# Patient Record
Sex: Male | Born: 1975 | Race: Black or African American | Hispanic: No | Marital: Married | Smoking: Former smoker
Health system: Southern US, Community
[De-identification: ages and names within clinical notes are randomized; demographics above are authoritative.]

## PROBLEM LIST (undated history)

## (undated) DIAGNOSIS — E785 Hyperlipidemia, unspecified: Secondary | ICD-10-CM

## (undated) DIAGNOSIS — E291 Testicular hypofunction: Secondary | ICD-10-CM

## (undated) DIAGNOSIS — F419 Anxiety disorder, unspecified: Secondary | ICD-10-CM

## (undated) DIAGNOSIS — F32A Depression, unspecified: Secondary | ICD-10-CM

## (undated) DIAGNOSIS — E119 Type 2 diabetes mellitus without complications: Secondary | ICD-10-CM

## (undated) DIAGNOSIS — I10 Essential (primary) hypertension: Secondary | ICD-10-CM

## (undated) HISTORY — DX: Testicular hypofunction: E29.1

## (undated) HISTORY — DX: Depression, unspecified: F32.A

## (undated) HISTORY — DX: Hyperlipidemia, unspecified: E78.5

## (undated) HISTORY — PX: TYMPANOSTOMY TUBE PLACEMENT: SHX32

---

## 2008-01-23 ENCOUNTER — Ambulatory Visit: Payer: Self-pay | Admitting: Cardiology

## 2012-05-29 DIAGNOSIS — E785 Hyperlipidemia, unspecified: Secondary | ICD-10-CM | POA: Insufficient documentation

## 2012-05-29 DIAGNOSIS — R400 Somnolence: Secondary | ICD-10-CM | POA: Insufficient documentation

## 2012-05-29 DIAGNOSIS — I10 Essential (primary) hypertension: Secondary | ICD-10-CM | POA: Insufficient documentation

## 2012-05-29 DIAGNOSIS — F32A Depression, unspecified: Secondary | ICD-10-CM | POA: Insufficient documentation

## 2012-08-16 DIAGNOSIS — E669 Obesity, unspecified: Secondary | ICD-10-CM | POA: Insufficient documentation

## 2012-08-23 ENCOUNTER — Emergency Department (HOSPITAL_COMMUNITY)
Admission: EM | Admit: 2012-08-23 | Discharge: 2012-08-23 | Disposition: A | Discharge status: Home / Self Care | Payer: 59 | Attending: Emergency Medicine | Admitting: Emergency Medicine

## 2012-08-23 ENCOUNTER — Emergency Department (HOSPITAL_COMMUNITY): Payer: 59

## 2012-08-23 ENCOUNTER — Encounter (HOSPITAL_COMMUNITY): Payer: Self-pay | Admitting: *Deleted

## 2012-08-23 DIAGNOSIS — Z8659 Personal history of other mental and behavioral disorders: Secondary | ICD-10-CM | POA: Insufficient documentation

## 2012-08-23 DIAGNOSIS — R002 Palpitations: Secondary | ICD-10-CM | POA: Insufficient documentation

## 2012-08-23 DIAGNOSIS — R059 Cough, unspecified: Secondary | ICD-10-CM | POA: Insufficient documentation

## 2012-08-23 DIAGNOSIS — R631 Polydipsia: Secondary | ICD-10-CM | POA: Insufficient documentation

## 2012-08-23 DIAGNOSIS — R110 Nausea: Secondary | ICD-10-CM | POA: Insufficient documentation

## 2012-08-23 DIAGNOSIS — R05 Cough: Secondary | ICD-10-CM | POA: Insufficient documentation

## 2012-08-23 DIAGNOSIS — R51 Headache: Secondary | ICD-10-CM | POA: Insufficient documentation

## 2012-08-23 DIAGNOSIS — R6883 Chills (without fever): Secondary | ICD-10-CM | POA: Insufficient documentation

## 2012-08-23 DIAGNOSIS — R6889 Other general symptoms and signs: Secondary | ICD-10-CM | POA: Insufficient documentation

## 2012-08-23 DIAGNOSIS — R61 Generalized hyperhidrosis: Secondary | ICD-10-CM | POA: Insufficient documentation

## 2012-08-23 DIAGNOSIS — R42 Dizziness and giddiness: Secondary | ICD-10-CM | POA: Insufficient documentation

## 2012-08-23 DIAGNOSIS — I10 Essential (primary) hypertension: Secondary | ICD-10-CM | POA: Insufficient documentation

## 2012-08-23 DIAGNOSIS — E119 Type 2 diabetes mellitus without complications: Secondary | ICD-10-CM | POA: Insufficient documentation

## 2012-08-23 DIAGNOSIS — J189 Pneumonia, unspecified organism: Secondary | ICD-10-CM | POA: Insufficient documentation

## 2012-08-23 HISTORY — DX: Anxiety disorder, unspecified: F41.9

## 2012-08-23 HISTORY — DX: Type 2 diabetes mellitus without complications: E11.9

## 2012-08-23 HISTORY — DX: Essential (primary) hypertension: I10

## 2012-08-23 LAB — CBC WITH DIFFERENTIAL/PLATELET
Hemoglobin: 13.2 g/dL (ref 13.0–17.0)
Lymphocytes Relative: 36 % (ref 12–46)
Lymphs Abs: 3.9 10*3/uL (ref 0.7–4.0)
MCV: 76.3 fL — ABNORMAL LOW (ref 78.0–100.0)
Monocytes Relative: 7 % (ref 3–12)
Neutrophils Relative %: 55 % (ref 43–77)
Platelets: 304 10*3/uL (ref 150–400)
RBC: 5.15 MIL/uL (ref 4.22–5.81)
WBC: 10.8 10*3/uL — ABNORMAL HIGH (ref 4.0–10.5)

## 2012-08-23 LAB — POCT I-STAT TROPONIN I: Troponin i, poc: 0.01 ng/mL (ref 0.00–0.08)

## 2012-08-23 LAB — COMPREHENSIVE METABOLIC PANEL
AST: 33 U/L (ref 0–37)
Albumin: 3.6 g/dL (ref 3.5–5.2)
Calcium: 9.4 mg/dL (ref 8.4–10.5)
Creatinine, Ser: 0.76 mg/dL (ref 0.50–1.35)
Potassium: 4.6 mEq/L (ref 3.5–5.1)
Sodium: 135 mEq/L (ref 135–145)
Total Protein: 7.4 g/dL (ref 6.0–8.3)

## 2012-08-23 LAB — LIPASE, BLOOD: Lipase: 36 U/L (ref 11–59)

## 2012-08-23 MED ORDER — AZITHROMYCIN 250 MG PO TABS: 250.0000 mg | ORAL_TABLET | Freq: Every day | ORAL | Status: DC

## 2012-08-23 MED ORDER — SODIUM CHLORIDE 0.9 % IV BOLUS (SEPSIS)
1000.0000 mL | Freq: Once | INTRAVENOUS | Status: AC
  Administered 2012-08-23: 1000 mL via INTRAVENOUS

## 2012-08-23 NOTE — ED Provider Notes (Signed)
History     CSN: [id number]  Arrival date & time 08/23/12  1022   First MD Initiated Contact with Patient 08/23/12 1028      Chief Complaint  Patient presents with  . Chest Pain    (Consider location/radiation/quality/duration/timing/severity/associated sxs/prior treatment) HPI Comments: Pt has been experiencing palpitations at rest, nausea, diaphoresis, and headaches for the past 2 days. Pt does not experience symptoms with exertion. Called his PCP and went in today for an appointment and was sent here by PCP.  Pt sent he felt these palpitations 2-3 years ago, saw a cardiologist, had a stress test, and told everything was negative. He was also successfully treated for vertigo about the same time for the headaches, dizziness, and nausea.   Pt denies any shortness of breath, difficulty breathing, pleuritic chest pain.  EKG at doctor's office shows non-specific T wave changes.   Patient is a 37 y.o. male presenting with chest pain.  Chest Pain Associated symptoms: cough, dizziness, headache, nausea and palpitations   Associated symptoms: no abdominal pain, no diaphoresis, no fever, no numbness, no shortness of breath, not vomiting and no weakness     Past Medical History  Diagnosis Date  . Hypertension   . Diabetes mellitus without complication   . Anxiety     History reviewed. No pertinent past surgical history.  History reviewed. No pertinent family history.  History  Substance Use Topics  . Smoking status: Never Smoker   . Smokeless tobacco: Not on file  . Alcohol Use: No      Review of Systems  Constitutional: Positive for chills. Negative for fever and diaphoresis.  HENT: Negative for neck pain and neck stiffness.   Eyes: Negative for visual disturbance.  Respiratory: Positive for cough. Negative for apnea, chest tightness and shortness of breath.   Cardiovascular: Positive for palpitations. Negative for chest pain.  Gastrointestinal: Positive for nausea.  Negative for vomiting, abdominal pain, diarrhea and constipation.  Endocrine: Positive for cold intolerance and polydipsia.  Genitourinary: Negative for dysuria.  Musculoskeletal: Negative for gait problem.  Skin: Negative for rash.  Neurological: Positive for dizziness, light-headedness and headaches. Negative for weakness and numbness.    Allergies  Review of patient's allergies indicates no known allergies.  Home Medications  No current outpatient prescriptions on file.  There were no vitals taken for this visit.  Physical Exam  Nursing note and vitals reviewed. Constitutional: He is oriented to person, place, and time. He appears well-developed and well-nourished. No distress.  HENT:  Head: Normocephalic and atraumatic.  Eyes: EOM are normal. Pupils are equal, round, and reactive to light.  Neck: Normal range of motion. Neck supple.  No meningeal signs  Cardiovascular: Normal rate, regular rhythm, normal heart sounds and intact distal pulses.  Exam reveals no gallop and no friction rub.   No murmur heard. Pulmonary/Chest: Effort normal and breath sounds normal. No respiratory distress. He has no wheezes. He has no rales. He exhibits no tenderness.  Abdominal: Soft. Bowel sounds are normal. He exhibits no distension. There is no tenderness. There is no rebound and no guarding.  Musculoskeletal: Normal range of motion. He exhibits no edema and no tenderness.  5/5 strength throughout. Good grip strength.  Neurological: He is alert and oriented to person, place, and time. No cranial nerve deficit.  No focal deficits. Sensitivity to light touch intact.  Skin: Skin is warm and dry. He is not diaphoretic. No erythema.    ED Course  Procedures (including critical care time)  Labs Reviewed  CBC WITH DIFFERENTIAL  COMPREHENSIVE METABOLIC PANEL  LIPASE, BLOOD   Results for orders placed during the hospital encounter of 08/23/12  CBC WITH DIFFERENTIAL      Result Value Range    WBC 10.8 (*) 4.0 - 10.5 K/uL   RBC 5.15  4.22 - 5.81 MIL/uL   Hemoglobin 13.2  13.0 - 17.0 g/dL   HCT 16.1  09.6 - 04.5 %   MCV 76.3 (*) 78.0 - 100.0 fL   MCH 25.6 (*) 26.0 - 34.0 pg   MCHC 33.6  30.0 - 36.0 g/dL   RDW 40.9  81.1 - 91.4 %   Platelets 304  150 - 400 K/uL   Neutrophils Relative 55  43 - 77 %   Neutro Abs 5.9  1.7 - 7.7 K/uL   Lymphocytes Relative 36  12 - 46 %   Lymphs Abs 3.9  0.7 - 4.0 K/uL   Monocytes Relative 7  3 - 12 %   Monocytes Absolute 0.8  0.1 - 1.0 K/uL   Eosinophils Relative 2  0 - 5 %   Eosinophils Absolute 0.2  0.0 - 0.7 K/uL   Basophils Relative 0  0 - 1 %   Basophils Absolute 0.0  0.0 - 0.1 K/uL  COMPREHENSIVE METABOLIC PANEL      Result Value Range   Sodium 135  135 - 145 mEq/L   Potassium 4.6  3.5 - 5.1 mEq/L   Chloride 99  96 - 112 mEq/L   CO2 27  19 - 32 mEq/L   Glucose, Bld 292 (*) 70 - 99 mg/dL   BUN 13  6 - 23 mg/dL   Creatinine, Ser 7.82  0.50 - 1.35 mg/dL   Calcium 9.4  8.4 - 95.6 mg/dL   Total Protein 7.4  6.0 - 8.3 g/dL   Albumin 3.6  3.5 - 5.2 g/dL   AST 33  0 - 37 U/L   ALT 51  0 - 53 U/L   Alkaline Phosphatase 91  39 - 117 U/L   Total Bilirubin 0.3  0.3 - 1.2 mg/dL   GFR calc non Af Amer >90  >90 mL/min   GFR calc Af Amer >90  >90 mL/min  LIPASE, BLOOD      Result Value Range   Lipase 36  11 - 59 U/L  POCT I-STAT TROPONIN I      Result Value Range   Troponin i, poc 0.01  0.00 - 0.08 ng/mL   Comment 3             Dg Chest 2 View  08/23/2012  *RADIOLOGY REPORT*  Clinical Data: Chest pain  CHEST - 2 VIEW  Comparison: None.  Findings: Low volumes.  Bibasilar atelectasis.  Normal heart size. No pneumothorax.  IMPRESSION: Bibasilar atelectasis.   Original Report Authenticated By: Jolaine Click, M.D.      Date: 08/23/2012  Rate: 87  Rhythm: sinus rhythm  QRS Axis: normal  Intervals: normal  ST/T Wave abnormalities: borderline T abnormalities, inferior leads  Conduction Disutrbances: none  Narrative Interpretation:  borderline EKG  Old EKG Reviewed: similar to EKG at PCP office earlier  No results found.   Diagnosis: pneumonia   MDM  Low clinical suspicion for ACS or PE.  Chest pain is not likely of cardiac or pulmonary etiology d/t presentation, perc negative, no tracheal deviation, no JVD or new murmur, RRR, breath sounds equal bilaterally, EKG without acute abnormalities, negative troponin.   CXR shows bibasilar  atelectasis could be concerning for possible pneumonia with ROS of cough, SOB, dizziness. Will d/c pt with zithromycin.  Pt has been advised to return to the ED is CP becomes exertional, associated with diaphoresis or nausea, radiates to left jaw/arm, worsens or becomes concerning in any way. Pt appears reliable for follow up and is agreeable to discharge. Patient is to be discharged with recommendation to follow up with PCP in regards to today's hospital visit.  Case has been discussed with and seen by Dr. Hyacinth Meeker who agrees with the above plan to discharge.    Glade Nurse, PA-C 08/23/12 [phone]

## 2012-08-23 NOTE — ED Notes (Signed)
Pt sent here from his PCP in reference to CP intermittently x couple of days.  States N/V/SOB/diaphoretic.  Pt deneis symptoms at this time.  12-lead unremarkable-T wave inversion.  650 aspirin given at PCP.  20ga SL IV (L) hand.

## 2012-08-23 NOTE — ED Notes (Signed)
NAD noted at time of d/c home 

## 2012-08-23 NOTE — ED Provider Notes (Signed)
37 year old obese male with a history of diabetes who presents with a complaint of feeling lightheaded. He has also been complaining of increased coughing over the last several days, becoming diaphoretic at work and feeling dizzy. On exam the patient has moist mucous membranes, normal mental status answering questions appropriately, clear heart and lung sounds without any murmurs rubs or gallops, no ectopy heard on cardiac monitoring and on auscultation.  Labs show the patient is hyperglycemic to close to 300, no other significant lab abnormalities, his EKG from his primary care Dr. showed a sinus rhythm at 95 beats per minute with normal axis, normal intervals, normal ST segments and no signs of LVH.  The patient is well-appearing, we'll treat with Zithromax and IV fluids, I encouraged him to followup closely with his family doctor.  PA and lateral views of the chest were obtained by digital radiography. I have personally interpreted these x-rays and find her to be no signs of pulmonary infiltrate, cardiomegaly, subdiaphragmatic free air, soft tissue abnormality, no obvious bony abnormalities or fractures.  There is atelectasis at the bases which in the setting of increased cough could be significant for an early pneumonia. He does not have a significant leukocytosis.   Medical screening examination/treatment/procedure(s) were conducted as a shared visit with non-physician practitioner(s) and myself.  I personally evaluated the patient during the encounter    Vida Roller, MD 08/23/12 1220

## 2012-08-26 NOTE — ED Provider Notes (Signed)
   Medical screening examination/treatment/procedure(s) were conducted as a shared visit with non-physician practitioner(s) and myself.  I personally evaluated the patient during the encounter  Please see my separate respective documentation pertaining to this patient encounter     Vida Roller, MD 08/26/12 1843

## 2013-12-01 IMAGING — CR DG CHEST 2V
2 series · 2 of 2 positions shown · non-contrast
Comparison: None.

CLINICAL DATA: Chest pain

CHEST - 2 VIEW

[w chest pa]
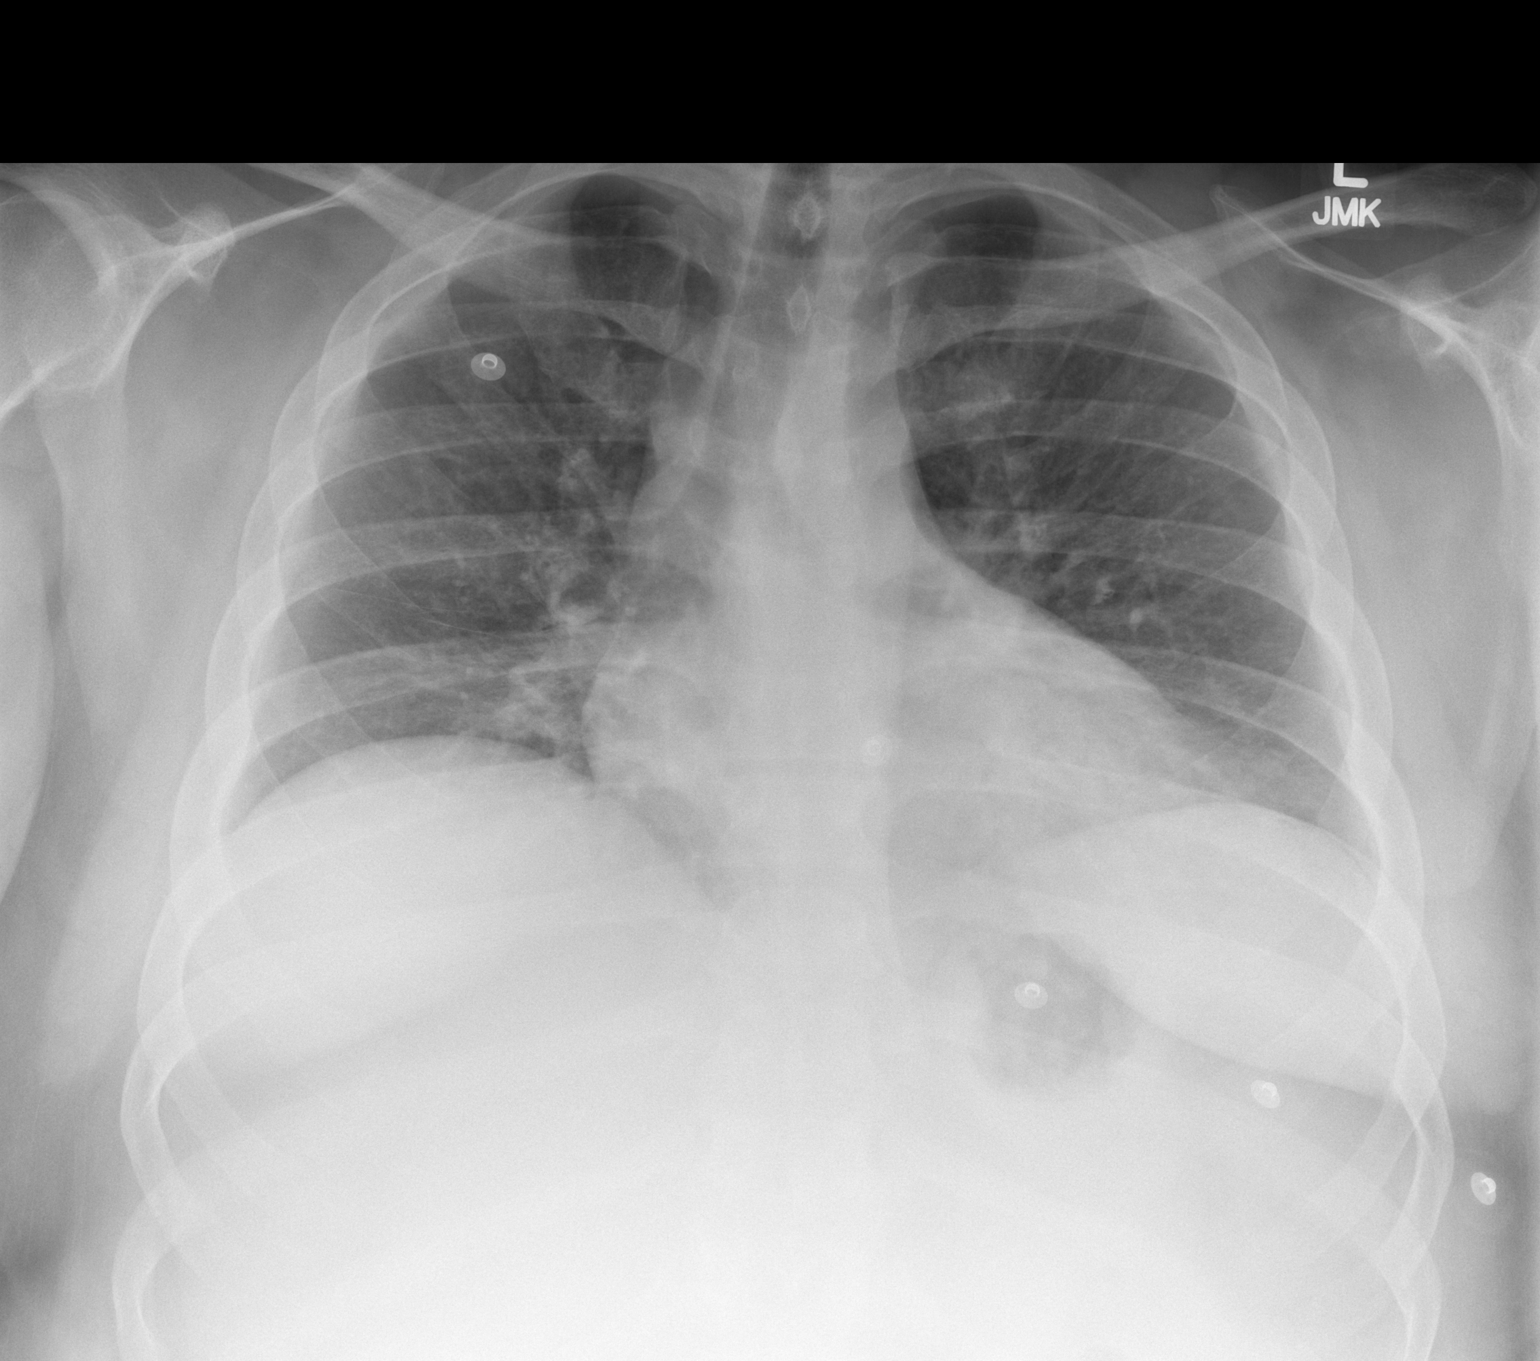

[w chest lat]
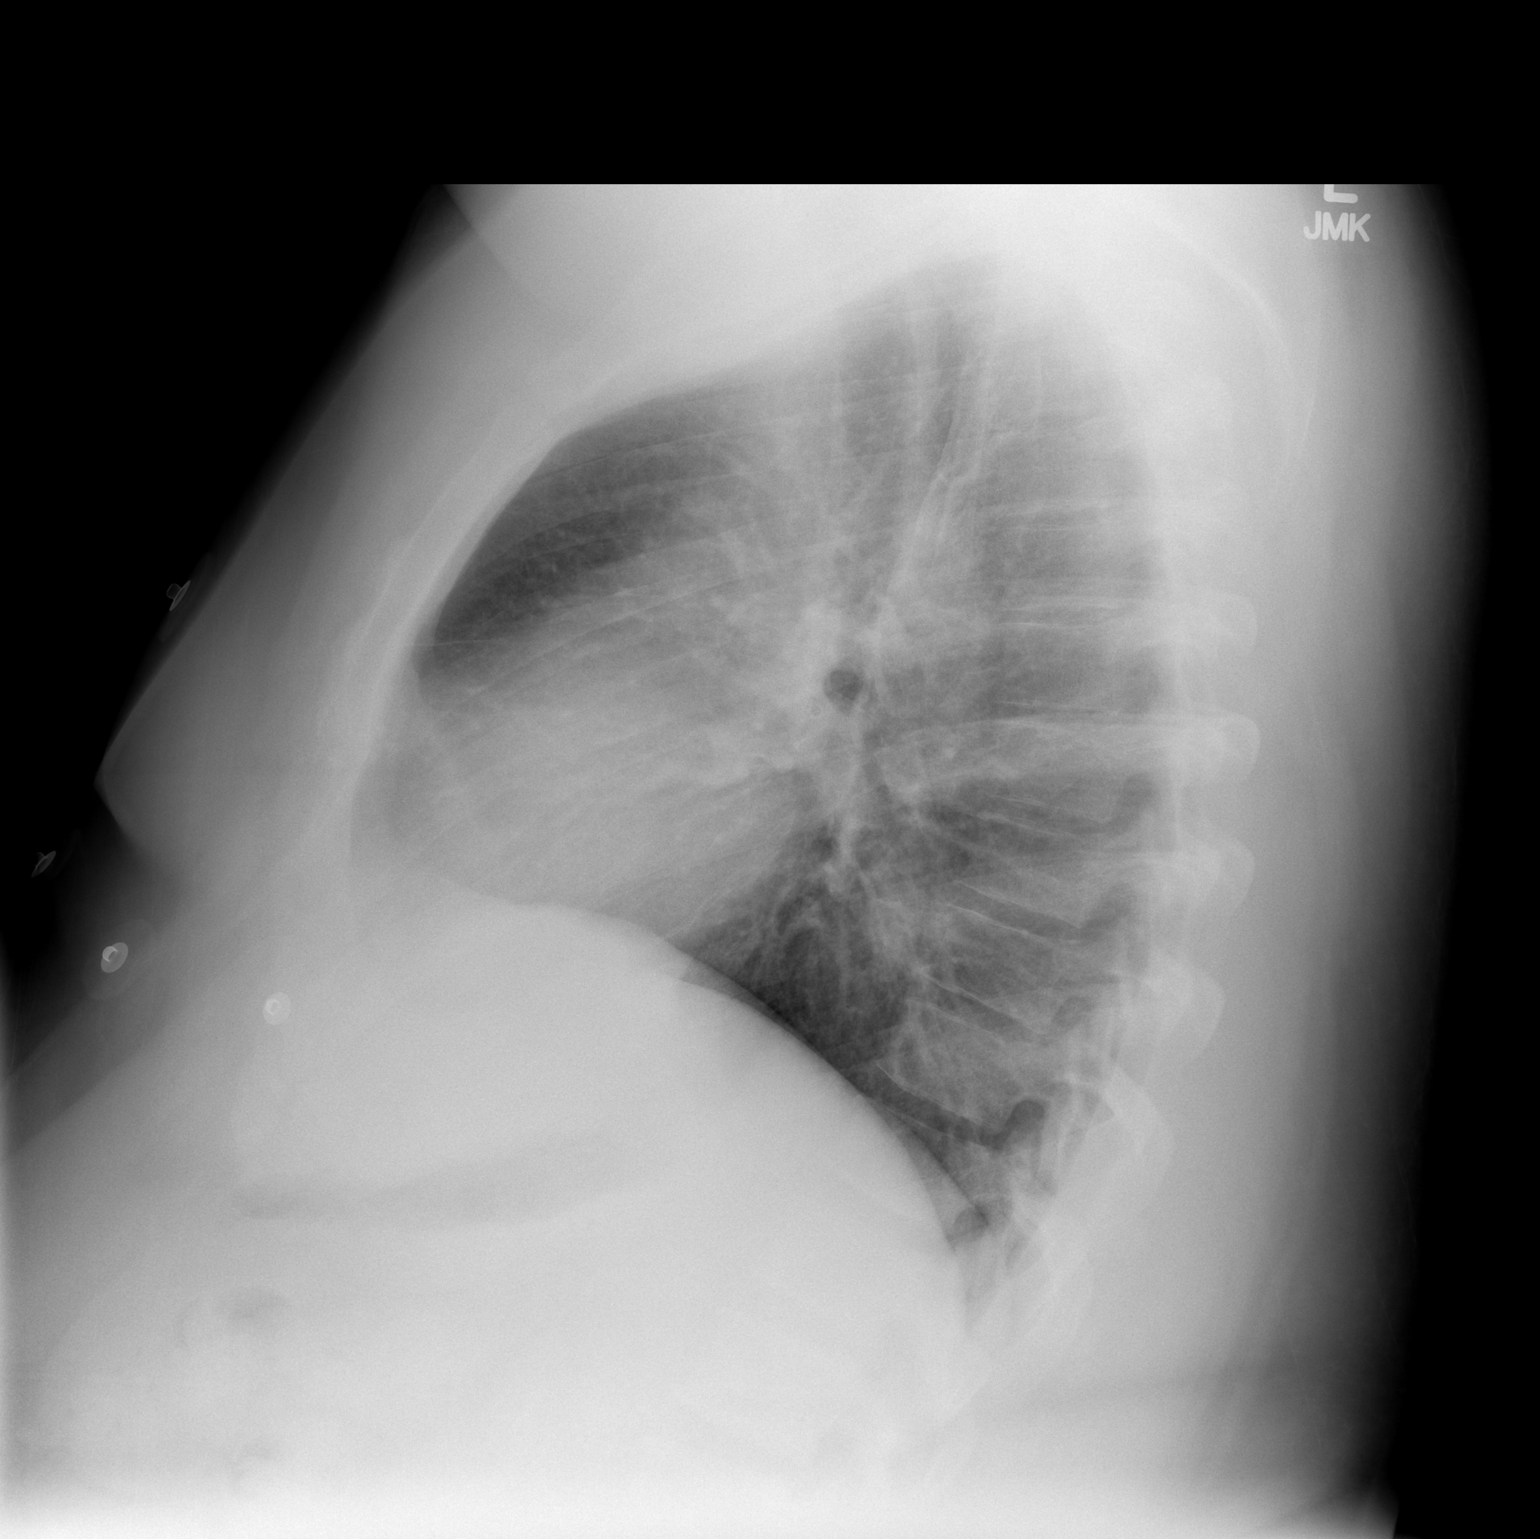

[2 of 2 positions shown; findings below may reference images not displayed]

FINDINGS: Low volumes.  Bibasilar atelectasis.  Normal heart size.
No pneumothorax.
IMPRESSION: Bibasilar atelectasis.

## 2014-05-22 DIAGNOSIS — R202 Paresthesia of skin: Secondary | ICD-10-CM | POA: Insufficient documentation

## 2014-05-22 DIAGNOSIS — E1142 Type 2 diabetes mellitus with diabetic polyneuropathy: Secondary | ICD-10-CM | POA: Insufficient documentation

## 2019-07-02 ENCOUNTER — Other Ambulatory Visit: Payer: Self-pay

## 2019-07-02 ENCOUNTER — Ambulatory Visit
Admission: EM | Admit: 2019-07-02 | Discharge: 2019-07-02 | Disposition: A | Discharge status: Home / Self Care | Payer: Self-pay

## 2019-07-02 DIAGNOSIS — R030 Elevated blood-pressure reading, without diagnosis of hypertension: Secondary | ICD-10-CM

## 2019-07-02 DIAGNOSIS — K047 Periapical abscess without sinus: Secondary | ICD-10-CM

## 2019-07-02 MED ORDER — TRAMADOL HCL 50 MG PO TABS: 50.0000 mg | ORAL_TABLET | Freq: Two times a day (BID) | ORAL | 0 refills | Status: DC | PRN

## 2019-07-02 MED ORDER — AMOXICILLIN-POT CLAVULANATE 875-125 MG PO TABS: 1.0000 | ORAL_TABLET | Freq: Two times a day (BID) | ORAL | 0 refills | Status: AC

## 2019-07-02 MED ORDER — CLONIDINE HCL ER 0.1 MG PO TB12
0.1000 mg | ORAL_TABLET | Freq: Once | ORAL | Status: AC
  Administered 2019-07-02: 0.1 mg via ORAL

## 2019-07-02 MED ORDER — CHLORHEXIDINE GLUCONATE 0.12 % MT SOLN: 15.0000 mL | Freq: Two times a day (BID) | OROMUCOSAL | 0 refills | Status: DC

## 2019-07-02 MED ORDER — CEFTRIAXONE SODIUM 1 G IJ SOLR
1.0000 g | Freq: Once | INTRAMUSCULAR | Status: AC
  Administered 2019-07-02: 13:00:00 1 g via INTRAMUSCULAR

## 2019-07-02 NOTE — ED Triage Notes (Signed)
Pt presents with dental abscess that developed 2 days ago

## 2019-07-02 NOTE — ED Triage Notes (Signed)
Provider made aware of elevated BP

## 2019-07-02 NOTE — ED Provider Notes (Signed)
[facility]   [id number] 07/02/19 Arrival Time: 1141  CC: DENTAL abscess; elevated blood pressure  SUBJECTIVE:  Joshua Chang is a 43 y.o. male who presents with dental abscess x 2 days ago.  Denies a precipitating event or trauma.  Localizes pain to left upper side.  Has tried OTC analgesics without relief.  Worse with chewing.  Does not see a dentist regularly. Complains of associated nausea.  Denies fever, chills, dysphagia, odynophagia, oral or neck swelling, vomiting, chest pain, SOB.    Incidentally, blood pressure elevated in office.  Hx of HTN.  Did not take BP meds this morning.  Takes losartan-HCTZ.  However, has been running high recently.  Was 190/<100 on christmas eve.  Does not have a PCP.  Complains of associated headaches and blurry vision x 1 week, currently asymptomatic.  Denies numbness/ tingling, weakness in arms or legs, slurred speech.    ROS: As per HPI.  All other pertinent ROS negative.     Past Medical History:  Diagnosis Date  . Anxiety   . Diabetes mellitus without complication   . Hypertension    No past surgical history on file. No Known Allergies No current facility-administered medications on file prior to encounter.   Current Outpatient Medications on File Prior to Encounter  Medication Sig Dispense Refill  . losartan-hydrochlorothiazide (HYZAAR) 100-12.5 MG tablet Take 100 tablets by mouth daily.    Marland Kitchen. ALPRAZolam (XANAX) 0.5 MG tablet Take 0.5 mg by mouth daily as needed for anxiety.    Marland Kitchen. aspirin EC 81 MG tablet Take 81 mg by mouth daily.    Marland Kitchen. azithromycin (ZITHROMAX Z-PAK) 250 MG tablet Take 1 tablet (250 mg total) by mouth daily. 500mg  PO day 1, then 250mg  PO days 205 6 tablet 0  . clonazePAM (KLONOPIN) 0.5 MG tablet Take 0.25 mg by mouth 2 (two) times daily.    . metFORMIN (GLUCOPHAGE) 1000 MG tablet Take 1,000 mg by mouth 2 (two) times daily with a meal.     Social History   Socioeconomic History  . Marital status: Married   Spouse name: Not on file  . Number of children: Not on file  . Years of education: Not on file  . Highest education level: Not on file  Occupational History  . Not on file  Tobacco Use  . Smoking status: Never Smoker  Substance and Sexual Activity  . Alcohol use: No  . Drug use: No  . Sexual activity: Not on file  Other Topics Concern  . Not on file  Social History Narrative  . Not on file   Social Determinants of Health   Financial Resource Strain:   . Difficulty of Paying Living Expenses: Not on file  Food Insecurity:   . Worried About Programme researcher, broadcasting/film/videounning Out of Food in the Last Year: Not on file  . Ran Out of Food in the Last Year: Not on file  Transportation Needs:   . Lack of Transportation (Medical): Not on file  . Lack of Transportation (Non-Medical): Not on file  Physical Activity:   . Days of Exercise per Week: Not on file  . Minutes of Exercise per Session: Not on file  Stress:   . Feeling of Stress : Not on file  Social Connections:   . Frequency of Communication with Friends and Family: Not on file  . Frequency of Social Gatherings with Friends and Family: Not on file  . Attends Religious Services: Not on file  . Active Member of Clubs  or Organizations: Not on file  . Attends Banker Meetings: Not on file  . Marital Status: Not on file  Intimate Partner Violence:   . Fear of Current or Ex-Partner: Not on file  . Emotionally Abused: Not on file  . Physically Abused: Not on file  . Sexually Abused: Not on file   No family history on file.  OBJECTIVE:  Vitals:   07/02/19 1151 07/02/19 1238  BP: (!) 211/142 (!) 205/122  Pulse: 96 88  Resp: 17   Temp: 98.7 F (37.1 C)   TempSrc: Oral   SpO2: 97%     General appearance: alert; no distress HENT: NCAT; PERRL, EOMI grossly; nares patent; normocephalic; atraumatic; dentition: fair; abscess present- left upper over left upper gums with areas of fluctuance; oropharynx clear, uvula midline, tolerating  secretions without difficulty Neck: supple without LAD Lungs: normal respirations; CTAB CV: RRR Skin: warm and dry Psychological: alert and cooperative; normal mood and affect  ASSESSMENT & PLAN:  1. Dental abscess   2. Elevated blood pressure reading     Meds ordered this encounter  Medications  . cefTRIAXone (ROCEPHIN) injection 1 g  . cloNIDine HCl (KAPVAY) ER tablet 0.1 mg  . amoxicillin-clavulanate (AUGMENTIN) 875-125 MG tablet    Sig: Take 1 tablet by mouth every 12 (twelve) hours for 10 days.    Dispense:  20 tablet    Refill:  0    Order Specific Question:   Supervising Provider    Answer:   Eustace Moore [[id number]]  . traMADol (ULTRAM) 50 MG tablet    Sig: Take 1 tablet (50 mg total) by mouth every 12 (twelve) hours as needed.    Dispense:  10 tablet    Refill:  0    Order Specific Question:   Supervising Provider    Answer:   Eustace Moore [[id number]]  . chlorhexidine (PERIDEX) 0.12 % solution    Sig: Use as directed 15 mLs in the mouth or throat 2 (two) times daily.    Dispense:  473 mL    Refill:  0    Order Specific Question:   Supervising Provider    Answer:   Eustace Moore [[id number]]   Dental abscess: Maintain oral hygiene care Rocephin injection given in office Augmentin prescribed.  Take as directed and to completion Chlorhexidine mouthwash prescribed.  Use as directed Tramadol for severe break-through pain.  DO NOT TAKE PRIOR TO driving or operating heavy machinery as this may cause drowsiness  Follow up with dentist or at the ED if symptoms not improving over the next 24-48 hours Return or go to the ED if you have any new or worsening symptoms such as fever, chills, difficulty swallowing, painful swallowing, oral or neck swelling, nausea, vomiting, chest pain, SOB, etc...  Elevated blood pressure:  Clonidine given in office Blood pressure 211/142; improved to 205/122 after clonidine Please continue to monitor blood pressure at home and  keep a log Eat a well balanced diet of fruits, vegetables and lean meats.  Avoid foods high in fat and salt Continue with prescribed blood pressure medication Follow up with PCP to establish care and for further evaluation and management of blood pressure Return or go to the ED if you have any new or worsening symptoms such as vision changes, fatigue, dizziness, chest pain, shortness of breath, nausea, swelling in your hands or feet, urinary symptoms, etc...   Reviewed expectations re: course of current medical issues. Questions answered. Outlined signs and  symptoms indicating need for more acute intervention. Patient verbalized understanding. After Visit Summary given.   Lestine Box, PA-C 07/02/19 1300

## 2019-07-02 NOTE — Discharge Instructions (Signed)
Dental abscess: Maintain oral hygiene care Rocephin injection given in office Augmentin prescribed.  Take as directed and to completion Chlorhexidine mouthwash prescribed.  Use as directed Tramadol for severe break-through pain.  DO NOT TAKE PRIOR TO driving or operating heavy machinery as this may cause drowsiness  Follow up with dentist or at the ED if symptoms not improving over the next 24-48 hours Return or go to the ED if you have any new or worsening symptoms such as fever, chills, difficulty swallowing, painful swallowing, oral or neck swelling, nausea, vomiting, chest pain, SOB, etc...  Elevated blood pressure:  Clonidine given in office Please continue to monitor blood pressure at home and keep a log Eat a well balanced diet of fruits, vegetables and lean meats.  Avoid foods high in fat and salt Continue with prescribed blood pressure medication Follow up with PCP to establish care and for further evaluation and management of blood pressure Return or go to the ED if you have any new or worsening symptoms such as vision changes, fatigue, dizziness, chest pain, shortness of breath, nausea, swelling in your hands or feet, urinary symptoms, etc..Marland Kitchen

## 2019-09-13 ENCOUNTER — Ambulatory Visit: Payer: Self-pay | Attending: Internal Medicine

## 2019-09-13 DIAGNOSIS — Z23 Encounter for immunization: Secondary | ICD-10-CM

## 2019-09-13 NOTE — Progress Notes (Signed)
   Covid-19 Vaccination Clinic  Name:  Joshua Chang    MRN: [id number] DOB: 12-08-1975  09/13/2019  Mr. Bankson was observed post Covid-19 immunization for 15 minutes without incident. He was provided with Vaccine Information Sheet and instruction to access the V-Safe system.   Mr. Mejorado was instructed to call 911 with any severe reactions post vaccine: Marland Kitchen Difficulty breathing  . Swelling of face and throat  . A fast heartbeat  . A bad rash all over body  . Dizziness and weakness   Immunizations Administered    Name Date Dose VIS Date Route   Moderna COVID-19 Vaccine 09/13/2019 10:12 AM 0.5 mL 06/03/2019 Intramuscular   Manufacturer: Moderna   Lot: [id number]   NDC: 09983-382-50

## 2019-10-15 ENCOUNTER — Encounter (INDEPENDENT_AMBULATORY_CARE_PROVIDER_SITE_OTHER): Payer: Self-pay | Admitting: Internal Medicine

## 2019-10-15 ENCOUNTER — Ambulatory Visit: Payer: 59 | Attending: Internal Medicine

## 2019-10-15 ENCOUNTER — Other Ambulatory Visit: Payer: Self-pay

## 2019-10-15 ENCOUNTER — Ambulatory Visit (INDEPENDENT_AMBULATORY_CARE_PROVIDER_SITE_OTHER): Payer: 59 | Admitting: Internal Medicine

## 2019-10-15 VITALS — BP 154/98 | HR 104 | Resp 18 | Ht 68.0 in | Wt 349.2 lb

## 2019-10-15 DIAGNOSIS — E559 Vitamin D deficiency, unspecified: Secondary | ICD-10-CM

## 2019-10-15 DIAGNOSIS — R5381 Other malaise: Secondary | ICD-10-CM

## 2019-10-15 DIAGNOSIS — E119 Type 2 diabetes mellitus without complications: Secondary | ICD-10-CM | POA: Diagnosis not present

## 2019-10-15 DIAGNOSIS — R5383 Other fatigue: Secondary | ICD-10-CM

## 2019-10-15 DIAGNOSIS — R6882 Decreased libido: Secondary | ICD-10-CM

## 2019-10-15 DIAGNOSIS — I10 Essential (primary) hypertension: Secondary | ICD-10-CM

## 2019-10-15 DIAGNOSIS — Z23 Encounter for immunization: Secondary | ICD-10-CM

## 2019-10-15 NOTE — Progress Notes (Signed)
   Covid-19 Vaccination Clinic  Name:  Joshua Chang    MRN: [id number] DOB: 1976-03-17  10/15/2019  [name] was observed post Covid-19 immunization for 15 minutes without incident. He was provided with Vaccine Information Sheet and instruction to access the V-Safe system.   [name] was instructed to call 911 with any severe reactions post vaccine: Marland Kitchen Difficulty breathing  . Swelling of face and throat  . A fast heartbeat  . A bad rash all over body  . Dizziness and weakness   Immunizations Administered    Name Date Dose VIS Date Route   Moderna COVID-19 Vaccine 10/15/2019  9:32 AM 0.5 mL 06/03/2019 Intramuscular   Manufacturer: Moderna   Lot: [id number]   NDC: 71245-809-98

## 2019-10-15 NOTE — Progress Notes (Signed)
Metrics: Intervention Frequency ACO  Documented Smoking Status Yearly  Screened one or more times in 24 months  Cessation Counseling or  Active cessation medication Past 24 months  Past 24 months   Guideline developer: UpToDate (See UpToDate for funding source) Date Released: 2014       Wellness Office Visit  Subjective:  Patient ID: Joshua Chang, male    DOB: Jan 18, 1976  Age: 44 y.o. MRN: [id number]  CC: This 44 year old man comes to our practice to establish care. HPI  He has a history of morbid obesity, type 2 diabetes which is apparently uncontrolled and hypertension. He has very poor energy levels.  He has virtually zero sex drive. He would like to improve his overall health, lose weight and control his diabetes. He also tends to have anxiety, which is situational and takes Xanax when he needs to.  He does not take it every day. Past Medical History:  Diagnosis Date  . Anxiety   . Diabetes mellitus without complication (HCC)   . Hypertension       Family History  Problem Relation Age of Onset  . Depression Mother   . Hyperlipidemia Mother   . Hypertension Father   . COPD Father   . Cancer Father   . 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   Social History   Tobacco Use  . Smoking status: Never Smoker  . Smokeless tobacco: Never Used  Substance Use Topics  . Alcohol use: No    Current Meds  Medication Sig  . ALPRAZolam (XANAX) 0.5 MG tablet Take 0.5 mg by mouth daily as needed for anxiety.  Marland Kitchen aspirin EC 81 MG tablet Take 81 mg by mouth daily.  Marland Kitchen losartan-hydrochlorothiazide (HYZAAR) 100-12.5 MG tablet Take 100 tablets by mouth daily.  . metFORMIN (GLUCOPHAGE) 1000 MG tablet Take 1,000 mg by mouth 2 (two) times daily with a meal.      Objective:   Today's Vitals: BP (!) 154/98 (BP Location: Right Arm, Patient Position: Sitting, Cuff  Size: Normal)   Pulse (!) 104   Resp 18   Ht 5\' 8"  (1.727 m)   Wt (!) 349 lb 3.2 oz (158.4 kg)   BMI 53.10 kg/m  Vitals with BMI 10/15/2019 07/02/2019 07/02/2019  Height 5\' 8"  - -  Weight 349 lbs 3 oz - -  BMI 53.11 - -  Systolic 154 205 07/04/2019  Diastolic 98 122 142  Pulse 104 88 96     Physical Exam  He is morbidly obese.  Blood pressure elevated today but this is his first visit in a somewhat anxious.  Alert and orientated without any obvious focal neurological signs.     Assessment   1. Diabetes mellitus without complication (HCC)   2. Essential hypertension, benign   3. Morbid obesity (HCC)   4. Decreased libido   5. Malaise and fatigue   6. Vitamin D deficiency disease       Tests ordered Orders Placed This Encounter  Procedures  . CBC  . COMPLETE METABOLIC PANEL WITH GFR  . Hemoglobin A1c  . Lipid panel  . T3, free  . T4  . TSH  . VITAMIN D 25 Hydroxy (Vit-D Deficiency, Fractures)  . Testosterone Total,Free,Bio, Males     Plan: 1. Blood work is ordered above. 2. We discussed his overall health and his desire to become healthier.  I discussed  nutrition in detail with the concept of intermittent fasting combined with more of a plant-based diet.  I gave him specifics, the importance of hydration, drinking at least 1 gallon of water a day for his weight. 3. I will see him in the next several weeks to discuss his results and further recommendations going forward. 4. I spent 45 minutes with this patient today, discussing the above.   No orders of the defined types were placed in this encounter.   Doree Albee, MD

## 2019-10-15 NOTE — Patient Instructions (Signed)
[facility] Dietary Recommendations for Weight Loss What to Avoid . Avoid added sugars o Often added sugar can be found in processed foods such as many condiments, dry cereals, cakes, cookies, chips, crisps, crackers, candies, sweetened drinks, etc.  o Read labels and AVOID/DECREASE use of foods with the following in their ingredient list: Sugar, fructose, high fructose corn syrup, sucrose, glucose, maltose, dextrose, molasses, cane sugar, brown sugar, any type of syrup, agave nectar, etc.   . Avoid snacking in between meals . Avoid foods made with flour o If you are going to eat food made with flour, choose those made with whole-grains; and, minimize your consumption as much as is tolerable . Avoid processed foods o These foods are generally stocked in the middle of the grocery store. Focus on shopping on the perimeter of the grocery.  . Avoid Meat  o We recommend following a plant-based diet at [facility]. Thus, we recommend avoiding meat as a general rule. Consider eating beans, legumes, eggs, and/or dairy products for regular protein sources o If you plan on eating meat limit to 4 ounces of meat at a time and choose lean options such as Fish, chicken, turkey. Avoid red meat intake such as pork and/or steak What to Include . Vegetables o GREEN LEAFY VEGETABLES: Kale, spinach, mustard greens, collard greens, cabbage, broccoli, etc. o OTHER: Asparagus, cauliflower, eggplant, carrots, peas, Brussel sprouts, tomatoes, bell peppers, zucchini, beets, cucumbers, etc. . Grains, seeds, and legumes o Beans: kidney beans, black eyed peas, garbanzo beans, black beans, pinto beans, etc. o Whole, unrefined grains: brown rice, barley, bulgur, oatmeal, etc. . Healthy fats  o Avoid highly processed fats such as vegetable oil o Examples of healthy fats: avocado, olives, virgin olive oil, dark chocolate (?72% Cocoa), nuts (peanuts, almonds, walnuts, cashews, pecans, etc.) . None to Low  Intake of Animal Sources of Protein o Meat sources: chicken, turkey, salmon, tuna. Limit to 4 ounces of meat at one time. o Consider limiting dairy sources, but when choosing dairy focus on: PLAIN Greek yogurt, cottage cheese, high-protein milk . Fruit o Choose berries  When to Eat . Intermittent Fasting: o Choosing not to eat for a specific time period, but DO FOCUS ON HYDRATION when fasting o Multiple Techniques: - Time Restricted Eating: eat 3 meals in a day, each meal lasting no more than 60 minutes, no snacks between meals - 16-18 hour fast: fast for 16 to 18 hours up to 7 days a week. Often suggested to start with 2-3 nonconsecutive days per week.  . Remember the time you sleep is counted as fasting.  . Examples of eating schedule: Fast from 7:00pm-11:00am. Eat between 11:00am-7:00pm.  - 24-hour fast: fast for 24 hours up to every other day. Often suggested to start with 1 day per week . Remember the time you sleep is counted as fasting . Examples of eating schedule:  o Eating day: eat 2-3 meals on your eating day. If doing 2 meals, each meal should last no more than 90 minutes. If doing 3 meals, each meal should last no more than 60 minutes. Finish last meal by 7:00pm. o Fasting day: Fast until 7:00pm.  o IF YOU FEEL UNWELL FOR ANY REASON/IN ANY WAY WHEN FASTING, STOP FASTING BY EATING A NUTRITIOUS SNACK OR LIGHT MEAL o ALWAYS FOCUS ON HYDRATION DURING FASTS - Acceptable Hydration sources: water, broths, tea/coffee (black tea/coffee is best but using a small amount of whole-fat dairy products in coffee/tea is acceptable).  -   Poor Hydration Sources: anything with sugar or artificial sweeteners added to it  These recommendations have been developed for patients that are actively receiving medical care from either Dr. Liel Rudden or Sarah Gray, DNP, NP-C at [facility]. These recommendations are developed for patients with specific medical conditions and are not meant to be  distributed or used by others that are not actively receiving care from either provider listed above at [facility]. It is not appropriate to participate in the above eating plans without proper medical supervision.   Reference: Fung, J. The obesity code. [place]: [place]; 2016.   

## 2019-10-16 LAB — CBC
HCT: 42.2 % (ref 38.5–50.0)
Hemoglobin: 13.3 g/dL (ref 13.2–17.1)
MCH: 25.4 pg — ABNORMAL LOW (ref 27.0–33.0)
MCHC: 31.5 g/dL — ABNORMAL LOW (ref 32.0–36.0)
MCV: 80.5 fL (ref 80.0–100.0)
MPV: 12.1 fL (ref 7.5–12.5)
Platelets: 327 10*3/uL (ref 140–400)
RBC: 5.24 10*6/uL (ref 4.20–5.80)
RDW: 14.8 % (ref 11.0–15.0)
WBC: 11.8 10*3/uL — ABNORMAL HIGH (ref 3.8–10.8)

## 2019-10-16 LAB — COMPLETE METABOLIC PANEL WITH GFR
AG Ratio: 1.6 (calc) (ref 1.0–2.5)
ALT: 25 U/L (ref 9–46)
AST: 17 U/L (ref 10–40)
Albumin: 4.3 g/dL (ref 3.6–5.1)
Alkaline phosphatase (APISO): 98 U/L (ref 36–130)
BUN: 16 mg/dL (ref 7–25)
CO2: 28 mmol/L (ref 20–32)
Calcium: 9.9 mg/dL (ref 8.6–10.3)
Chloride: 101 mmol/L (ref 98–110)
Creat: 1.12 mg/dL (ref 0.60–1.35)
GFR, Est African American: 93 mL/min/{1.73_m2} (ref >=60)
GFR, Est Non African American: 81 mL/min/{1.73_m2} (ref >=60)
Globulin: 2.7 g/dL (calc) (ref 1.9–3.7)
Glucose, Bld: 322 mg/dL — ABNORMAL HIGH (ref 65–99)
Potassium: 4.6 mmol/L (ref 3.5–5.3)
Sodium: 139 mmol/L (ref 135–146)
Total Bilirubin: 0.3 mg/dL (ref 0.2–1.2)
Total Protein: 7 g/dL (ref 6.1–8.1)

## 2019-10-16 LAB — TESTOSTERONE TOTAL,FREE,BIO, MALES
Albumin: 4.3 g/dL (ref 3.6–5.1)
Sex Hormone Binding: 17 nmol/L (ref 10–50)
Testosterone: 195 ng/dL — ABNORMAL LOW (ref 250–827)

## 2019-10-16 LAB — VITAMIN D 25 HYDROXY (VIT D DEFICIENCY, FRACTURES): Vit D, 25-Hydroxy: 11 ng/mL — ABNORMAL LOW (ref 30–100)

## 2019-10-16 LAB — TSH: TSH: 1.27 mIU/L (ref 0.40–4.50)

## 2019-10-16 LAB — HEMOGLOBIN A1C
Hgb A1c MFr Bld: 11.8 % of total Hgb — ABNORMAL HIGH (ref <5.7)
Mean Plasma Glucose: 292 (calc)
eAG (mmol/L): 16.2 (calc)

## 2019-10-16 LAB — T3, FREE: T3, Free: 3 pg/mL (ref 2.3–4.2)

## 2019-10-16 LAB — T4: T4, Total: 8.7 ug/dL (ref 4.9–10.5)

## 2019-10-16 LAB — LIPID PANEL
Cholesterol: 206 mg/dL — ABNORMAL HIGH (ref <200)
HDL: 50 mg/dL (ref >=40)
LDL Cholesterol (Calc): 134 mg/dL (calc) — ABNORMAL HIGH
Non-HDL Cholesterol (Calc): 156 mg/dL (calc) — ABNORMAL HIGH (ref <130)
Total CHOL/HDL Ratio: 4.1 (calc) (ref <5.0)
Triglycerides: 109 mg/dL (ref <150)

## 2019-10-23 ENCOUNTER — Ambulatory Visit (INDEPENDENT_AMBULATORY_CARE_PROVIDER_SITE_OTHER): Payer: Self-pay | Admitting: Internal Medicine

## 2019-11-24 ENCOUNTER — Ambulatory Visit (INDEPENDENT_AMBULATORY_CARE_PROVIDER_SITE_OTHER): Payer: 59 | Admitting: Internal Medicine

## 2019-11-24 ENCOUNTER — Encounter (INDEPENDENT_AMBULATORY_CARE_PROVIDER_SITE_OTHER): Payer: Self-pay | Admitting: Internal Medicine

## 2019-11-24 ENCOUNTER — Other Ambulatory Visit: Payer: Self-pay

## 2019-11-24 VITALS — BP 136/89 | HR 88 | Temp 97.0°F | Resp 18 | Ht 68.0 in | Wt 347.0 lb

## 2019-11-24 DIAGNOSIS — I10 Essential (primary) hypertension: Secondary | ICD-10-CM

## 2019-11-24 DIAGNOSIS — R6882 Decreased libido: Secondary | ICD-10-CM

## 2019-11-24 DIAGNOSIS — E559 Vitamin D deficiency, unspecified: Secondary | ICD-10-CM

## 2019-11-24 DIAGNOSIS — E119 Type 2 diabetes mellitus without complications: Secondary | ICD-10-CM

## 2019-11-24 MED ORDER — AMOXICILLIN-POT CLAVULANATE 875-125 MG PO TABS: 1.0000 | ORAL_TABLET | Freq: Two times a day (BID) | ORAL | 0 refills | Status: DC

## 2019-11-24 MED ORDER — PREGABALIN 25 MG PO CAPS: 25.0000 mg | ORAL_CAPSULE | Freq: Two times a day (BID) | ORAL | 3 refills | Status: DC

## 2019-11-24 MED ORDER — EMPAGLIFLOZIN 10 MG PO TABS: 10.0000 mg | ORAL_TABLET | Freq: Every day | ORAL | 3 refills | Status: DC

## 2019-11-24 MED ORDER — ALPRAZOLAM 0.25 MG PO TABS: 0.2500 mg | ORAL_TABLET | Freq: Two times a day (BID) | ORAL | 0 refills | Status: DC | PRN

## 2019-11-24 MED ORDER — TESTOSTERONE CYPIONATE 200 MG/ML IM SOLN: 100.0000 mg | INTRAMUSCULAR | 0 refills | Status: DC

## 2019-11-24 NOTE — Progress Notes (Signed)
Metrics: Intervention Frequency ACO  Documented Smoking Status Yearly  Screened one or more times in 24 months  Cessation Counseling or  Active cessation medication Past 24 months  Past 24 months   Guideline developer: UpToDate (See UpToDate for funding source) Date Released: 2014       Wellness Office Visit  Subjective:  Patient ID: [facility], male    DOB: May 21, 1976  Age: 44 y.o. MRN: [id number]  CC: This man comes in for follow-up regarding his blood work and further recommendations. HPI  His blood work shows that his diabetes is poorly controlled with a hemoglobin A1c above 11%. He also has vitamin D deficiency. He also has low testosterone levels.  He does describe significantly reduced energy, generally feeling unwell, decreased libido. He just wants to feel better. He has been trying to follow nutritional guidelines again last visit and apparently had managed to lose 17 pounds but then he had some stressor and he has gained most of this back.  Nonetheless, he has managed to lose 2 pounds from the last visit. He also has some boils in his private area that he wants some antibiotics for. He also describes what appeared to be neuropathic pain in both his legs.  He has tried gabapentin in the past without success. He also suffers from anxiety and wonders if I can refill Xanax.  He does not use it very often at all. Past Medical History:  Diagnosis Date  . Anxiety   . Diabetes mellitus without complication ([place])   . Hypertension       Family History  Problem Relation Age of Onset  . Depression Mother   . Hyperlipidemia Mother   . Hypertension Father   . COPD Father   . Cancer Father   . 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   Social History   Tobacco Use  . Smoking status: Never Smoker  . Smokeless tobacco: Never Used  Substance Use Topics   . Alcohol use: No    Current Meds  Medication Sig  . aspirin EC 81 MG tablet Take 81 mg by mouth daily.  Marland Kitchen losartan-hydrochlorothiazide (HYZAAR) 100-12.5 MG tablet Take 100 tablets by mouth daily.  . metFORMIN (GLUCOPHAGE) 1000 MG tablet Take 1,000 mg by mouth 2 (two) times daily with a meal.      Depression screen [facility] 2/9 10/15/2019  Decreased Interest 2  Down, Depressed, Hopeless 1  PHQ - 2 Score 3  Altered sleeping 2  Tired, decreased energy 3  Change in appetite 3  Feeling bad or failure about yourself  1  Trouble concentrating 0  Moving slowly or fidgety/restless 0  Suicidal thoughts 0  PHQ-9 Score 12     Objective:   Today's Vitals: BP 136/89 (BP Location: Right Arm, Patient Position: Sitting, Cuff Size: Normal)   Pulse 88   Temp (!) 97 F (36.1 C) (Temporal)   Resp 18   Ht 5\' 8"  (1.727 m)   Wt (!) 347 lb (157.4 kg)   SpO2 97%   BMI 52.76 kg/m  Vitals with BMI 11/24/2019 10/15/2019 07/02/2019  Height 5\' 8"  5\' 8"  -  Weight 347 lbs 349 lbs 3 oz -  BMI 18.29 93.71 -  Systolic 696 789 381  Diastolic 89 98 017  Pulse 88 104 88     Physical Exam    He remains morbidly obese but  he has lost 2 pounds.  His blood pressure has actually improved since last visit.   Assessment   1. Diabetes mellitus without complication (HCC)   2. Essential hypertension, benign   3. Morbid obesity (HCC)   4. Decreased libido   5. Vitamin D deficiency disease       Tests ordered No orders of the defined types were placed in this encounter.    Plan: 1.  I recommended he start taking vitamin D3 10,000 units daily. 2.  As far as his diabetes is concerned, I am going to add Jardiance to his medications as I think he needs something more than just Metformin.  He will also continue to work with nutrition as before and get back on track. 3.  I am going to prescribe Lyrica for his what appears to be diabetic neuropathy on his legs. 4.  I will send in prescription for  Augmentin for his abscesses around his genitals. 5.  We discussed testosterone therapy and I discussed at length the FDA warnings, benefits and side effects.  He would like to proceed with testosterone injections and I have sent a prescription.  He will come back for education on administration. 6.  I will see him in about 6 weeks time for follow-up when we will repeat blood work and see how he is doing.   Meds ordered this encounter  Medications  . empagliflozin (JARDIANCE) 10 MG TABS tablet    Sig: Take 1 tablet (10 mg total) by mouth daily before breakfast.    Dispense:  30 tablet    Refill:  3  . amoxicillin-clavulanate (AUGMENTIN) 875-125 MG tablet    Sig: Take 1 tablet by mouth 2 (two) times daily.    Dispense:  20 tablet    Refill:  0  . pregabalin (LYRICA) 25 MG capsule    Sig: Take 1 capsule (25 mg total) by mouth 2 (two) times daily.    Dispense:  60 capsule    Refill:  3  . testosterone cypionate (DEPO-TESTOSTERONE) 200 MG/ML injection    Sig: Inject 0.5 mLs (100 mg total) into the muscle every 7 (seven) days.    Dispense:  10 mL    Refill:  0  . ALPRAZolam (XANAX) 0.25 MG tablet    Sig: Take 1 tablet (0.25 mg total) by mouth 2 (two) times daily as needed for anxiety.    Dispense:  20 tablet    Refill:  0    Ricci Dirocco Normajean Glasgow, MD

## 2019-11-24 NOTE — Patient Instructions (Signed)
VITAMIN D3 10,000 UNITS/DAY 

## 2019-12-29 ENCOUNTER — Encounter (INDEPENDENT_AMBULATORY_CARE_PROVIDER_SITE_OTHER): Payer: Self-pay | Admitting: Internal Medicine

## 2019-12-29 ENCOUNTER — Other Ambulatory Visit (INDEPENDENT_AMBULATORY_CARE_PROVIDER_SITE_OTHER): Payer: Self-pay | Admitting: Internal Medicine

## 2019-12-29 MED ORDER — AMOXICILLIN-POT CLAVULANATE 875-125 MG PO TABS: 1.0000 | ORAL_TABLET | Freq: Two times a day (BID) | ORAL | 0 refills | Status: DC

## 2019-12-29 NOTE — Telephone Encounter (Signed)
Please advise 

## 2020-01-12 ENCOUNTER — Ambulatory Visit (INDEPENDENT_AMBULATORY_CARE_PROVIDER_SITE_OTHER): Payer: 59 | Admitting: Internal Medicine

## 2020-01-12 ENCOUNTER — Other Ambulatory Visit: Payer: Self-pay

## 2020-01-12 ENCOUNTER — Encounter (INDEPENDENT_AMBULATORY_CARE_PROVIDER_SITE_OTHER): Payer: Self-pay | Admitting: Internal Medicine

## 2020-01-12 VITALS — BP 145/90 | HR 111 | Temp 97.7°F | Ht 68.0 in | Wt 325.8 lb

## 2020-01-12 DIAGNOSIS — E119 Type 2 diabetes mellitus without complications: Secondary | ICD-10-CM

## 2020-01-12 DIAGNOSIS — E559 Vitamin D deficiency, unspecified: Secondary | ICD-10-CM

## 2020-01-12 DIAGNOSIS — I10 Essential (primary) hypertension: Secondary | ICD-10-CM

## 2020-01-12 DIAGNOSIS — R6882 Decreased libido: Secondary | ICD-10-CM

## 2020-01-12 NOTE — Progress Notes (Signed)
Metrics: Intervention Frequency ACO  Documented Smoking Status Yearly  Screened one or more times in 24 months  Cessation Counseling or  Active cessation medication Past 24 months  Past 24 months   Guideline developer: UpToDate (See UpToDate for funding source) Date Released: 2014       Wellness Office Visit  Subjective:  Patient ID: [facility], male    DOB: 1976-01-25  Age: 44 y.o. MRN: [id number]  CC: This man comes in for follow-up of morbid obesity, diabetes, hypogonadism, hypertension. HPI  Since the last time I saw him, he has done extremely well following intermittent fasting, 16 hours on at least 5 days of the week combined with a more plant-based diet.  As a result, he has managed to lose weight. He feels better, he does not need to use his Xanax medication as often as he used to. He has more energy with the use of testosterone therapy also and his attitude is more positive. Past Medical History:  Diagnosis Date  . Anxiety   . Diabetes mellitus without complication (HCC)   . Hypertension    Past Surgical History:  Procedure Laterality Date  . TYMPANOSTOMY TUBE PLACEMENT       Family History  Problem Relation Age of Onset  . Depression Mother   . Hyperlipidemia Mother   . Hypertension Father   . COPD Father   . Cancer Father   . 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   Social History   Tobacco Use  . Smoking status: Never Smoker  . Smokeless tobacco: Never Used  Substance Use Topics  . Alcohol use: No    Current Meds  Medication Sig  . ALPRAZolam (XANAX) 0.25 MG tablet Take 1 tablet (0.25 mg total) by mouth 2 (two) times daily as needed for anxiety.  Marland Kitchen aspirin EC 81 MG tablet Take 81 mg by mouth daily.  . Cholecalciferol (VITAMIN D3) 125 MCG (5000 UT) TABS Take 2 tablets by mouth daily.  . empagliflozin (JARDIANCE) 10 MG TABS  tablet Take 1 tablet (10 mg total) by mouth daily before breakfast.  . losartan-hydrochlorothiazide (HYZAAR) 100-12.5 MG tablet Take 100 tablets by mouth daily.  . metFORMIN (GLUCOPHAGE) 1000 MG tablet Take 1,000 mg by mouth 2 (two) times daily with a meal.  . testosterone cypionate (DEPO-TESTOSTERONE) 200 MG/ML injection Inject 0.5 mLs (100 mg total) into the muscle every 7 (seven) days.  . [DISCONTINUED] amoxicillin-clavulanate (AUGMENTIN) 875-125 MG tablet Take 1 tablet by mouth 2 (two) times daily.  . [DISCONTINUED] pregabalin (LYRICA) 25 MG capsule Take 1 capsule (25 mg total) by mouth 2 (two) times daily.       Depression screen PH[facility] 2/9 10/15/2019  Decreased Interest 2  Down, Depressed, Hopeless 1  PHQ - 2 Score 3  Altered sleeping 2  Tired, decreased energy 3  Change in appetite 3  Feeling bad or failure about yourself  1  Trouble concentrating 0  Moving slowly or fidgety/restless 0  Suicidal thoughts 0  PHQ-9 Score 12     Objective:   Today's Vitals: BP (!) 145/90 (BP Location: Left Arm, Patient Position: Sitting, Cuff Size: Normal)   Pulse (!) 111   Temp 97.7 F (36.5 C) (Temporal)   Ht 5\' 8"  (1.727 m)   Wt (!) 325 lb 12.8 oz (147.8 kg)   SpO2 98%   BMI 49.54 kg/m  Vitals with  BMI 01/12/2020 11/24/2019 10/15/2019  Height 5\' 8"  5\' 8"  5\' 8"   Weight 325 lbs 13 oz 347 lbs 349 lbs 3 oz  BMI 49.55 52.77 53.11  Systolic 145 136  Diastolic 90 89 98  Pulse 111 88 104     Physical Exam   He looks systemically well, he has lost 22 pounds since last visit.    Assessment   1. Diabetes mellitus without complication (HCC)   2. Morbid obesity (HCC)   3. Essential hypertension, benign   4. Decreased libido   5. Vitamin D deficiency disease       Tests ordered Orders Placed This Encounter  Procedures  . COMPLETE METABOLIC PANEL WITH GFR  . Hemoglobin A1c  . Testosterone Total,Free,Bio, Males  . VITAMIN D 25 Hydroxy (Vit-D Deficiency, Fractures)      Plan: 1. For his diabetes, he will continue with Jardiance and Metformin and he will come back in couple of days time to get an A1c checked. 2. He will continue to work on his morbid obesity and he is losing weight.  We discussed the use of frozen vegetables which he can microwave which also might help him.  He will continue to follow intermittent fasting combined with a plant-based diet. 3. His hypertension is not as well-controlled as I would like at the present time but he is working on losing weight so anticipate this will improve.  He will continue with the same dose of Hyzaar for the time being. 4. As far as his low testosterone levels are concerned, he will continue with testosterone therapy as before and I will check levels. 5. He will continue with vitamin D3 10,000 units daily for vitamin D deficiency and we will check levels. 6. Follow-up in 6 weeks time for close monitoring.  Further recommendations will depend on blood results.   No orders of the defined types were placed in this encounter.   , MD

## 2020-01-12 NOTE — Progress Notes (Deleted)
Metrics: Intervention Frequency ACO  Documented Smoking Status Yearly  Screened one or more times in 24 months  Cessation Counseling or  Active cessation medication Past 24 months  Past 24 months   Guideline developer: UpToDate (See UpToDate for funding source) Date Released: 2014       Wellness Office Visit  Subjective:  Patient ID: [facility], male    DOB: 02/28/1976  Age: 44 y.o. MRN: [id number]  CC:  HPI   Past Medical History:  Diagnosis Date  . Anxiety   . Diabetes mellitus without complication (HCC)   . Hypertension    Past Surgical History:  Procedure Laterality Date  . TYMPANOSTOMY TUBE PLACEMENT       Family History  Problem Relation Age of Onset  . Depression Mother   . Hyperlipidemia Mother   . Hypertension Father   . COPD Father   . Cancer Father   . 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   Social History   Tobacco Use  . Smoking status: Never Smoker  . Smokeless tobacco: Never Used  Substance Use Topics  . Alcohol use: No    Current Meds  Medication Sig  . ALPRAZolam (XANAX) 0.25 MG tablet Take 1 tablet (0.25 mg total) by mouth 2 (two) times daily as needed for anxiety.  Marland Kitchen aspirin EC 81 MG tablet Take 81 mg by mouth daily.  . Cholecalciferol (VITAMIN D3) 125 MCG (5000 UT) TABS Take 2 tablets by mouth daily.  . empagliflozin (JARDIANCE) 10 MG TABS tablet Take 1 tablet (10 mg total) by mouth daily before breakfast.  . losartan-hydrochlorothiazide (HYZAAR) 100-12.5 MG tablet Take 100 tablets by mouth daily.  . metFORMIN (GLUCOPHAGE) 1000 MG tablet Take 1,000 mg by mouth 2 (two) times daily with a meal.  . testosterone cypionate (DEPO-TESTOSTERONE) 200 MG/ML injection Inject 0.5 mLs (100 mg total) into the muscle every 7 (seven) days.  . [DISCONTINUED] amoxicillin-clavulanate (AUGMENTIN) 875-125 MG tablet Take 1 tablet by mouth  2 (two) times daily.  . [DISCONTINUED] pregabalin (LYRICA) 25 MG capsule Take 1 capsule (25 mg total) by mouth 2 (two) times daily.     Nutrition  *** Sleep  ***  Exercise  *** Bio Identical Hormones  ***  Depression screen [facility] 2/9 10/15/2019  Decreased Interest 2  Down, Depressed, Hopeless 1  PHQ - 2 Score 3  Altered sleeping 2  Tired, decreased energy 3  Change in appetite 3  Feeling bad or failure about yourself  1  Trouble concentrating 0  Moving slowly or fidgety/restless 0  Suicidal thoughts 0  PHQ-9 Score 12     Objective:   Today's Vitals: BP (!) 145/90 (BP Location: Left Arm, Patient Position: Sitting, Cuff Size: Normal)   Pulse (!) 111   Temp 97.7 F (36.5 C) (Temporal)   Ht 5\' 8"  (1.727 m)   Wt (!) 325 lb 12.8 oz (147.8 kg)   SpO2 98%   BMI 49.54 kg/m  Vitals with BMI 01/12/2020 11/24/2019 10/15/2019  Height 5\' 8"  5\' 8"  5\' 8"   Weight 325 lbs 13 oz 347 lbs 349 lbs 3 oz  BMI 49.55 52.77 53.11  Systolic 145 136 10/17/2019  Diastolic 90 89 98  Pulse 111 88 104     Physical Exam       Assessment   1. Diabetes mellitus without complication (HCC)   2. Morbid obesity (HCC)  3. Essential hypertension, benign   4. Decreased libido   5. Vitamin D deficiency disease       Tests ordered Orders Placed This Encounter  Procedures  . COMPLETE METABOLIC PANEL WITH GFR  . Hemoglobin A1c  . Testosterone Total,Free,Bio, Males  . VITAMIN D 25 Hydroxy (Vit-D Deficiency, Fractures)     Plan: 1.    No orders of the defined types were placed in this encounter.   Wilson Singer, MD

## 2020-01-15 ENCOUNTER — Other Ambulatory Visit: Payer: Self-pay

## 2020-01-15 ENCOUNTER — Other Ambulatory Visit (INDEPENDENT_AMBULATORY_CARE_PROVIDER_SITE_OTHER): Payer: 59

## 2020-01-16 LAB — COMPLETE METABOLIC PANEL WITH GFR
AG Ratio: 1.5 (calc) (ref 1.0–2.5)
ALT: 26 U/L (ref 9–46)
AST: 18 U/L (ref 10–40)
Albumin: 4.1 g/dL (ref 3.6–5.1)
Alkaline phosphatase (APISO): 78 U/L (ref 36–130)
BUN: 12 mg/dL (ref 7–25)
CO2: 26 mmol/L (ref 20–32)
Calcium: 9.6 mg/dL (ref 8.6–10.3)
Chloride: 101 mmol/L (ref 98–110)
Creat: 1.04 mg/dL (ref 0.60–1.35)
GFR, Est African American: 102 mL/min/{1.73_m2} (ref >=60)
GFR, Est Non African American: 88 mL/min/{1.73_m2} (ref >=60)
Globulin: 2.8 g/dL (calc) (ref 1.9–3.7)
Glucose, Bld: 172 mg/dL — ABNORMAL HIGH (ref 65–99)
Potassium: 3.9 mmol/L (ref 3.5–5.3)
Sodium: 140 mmol/L (ref 135–146)
Total Bilirubin: 0.5 mg/dL (ref 0.2–1.2)
Total Protein: 6.9 g/dL (ref 6.1–8.1)

## 2020-01-16 LAB — HEMOGLOBIN A1C
Hgb A1c MFr Bld: 8.3 % of total Hgb — ABNORMAL HIGH (ref <5.7)
Mean Plasma Glucose: 192 (calc)
eAG (mmol/L): 10.6 (calc)

## 2020-01-16 LAB — TESTOSTERONE TOTAL,FREE,BIO, MALES
Albumin: 4.1 g/dL (ref 3.6–5.1)
Sex Hormone Binding: 20 nmol/L (ref 10–50)
Testosterone, Bioavailable: 178.1 ng/dL (ref >=110.0)
Testosterone, Free: 94.6 pg/mL (ref 46.0–224.0)
Testosterone: 463 ng/dL (ref 250–827)

## 2020-01-16 LAB — VITAMIN D 25 HYDROXY (VIT D DEFICIENCY, FRACTURES): Vit D, 25-Hydroxy: 52 ng/mL (ref 30–100)

## 2020-01-27 ENCOUNTER — Other Ambulatory Visit (INDEPENDENT_AMBULATORY_CARE_PROVIDER_SITE_OTHER): Payer: Self-pay | Admitting: Internal Medicine

## 2020-01-27 ENCOUNTER — Telehealth (INDEPENDENT_AMBULATORY_CARE_PROVIDER_SITE_OTHER): Payer: Self-pay

## 2020-01-27 MED ORDER — EMPAGLIFLOZIN 10 MG PO TABS: 10.0000 mg | ORAL_TABLET | Freq: Every day | ORAL | 1 refills | Status: DC

## 2020-01-27 NOTE — Telephone Encounter (Signed)
Done

## 2020-01-27 NOTE — Telephone Encounter (Signed)
[place] is needing a 90 day supply on the Jardiance 10mg  for insurance purposes

## 2020-01-27 NOTE — Telephone Encounter (Signed)
Please let the patient know that I have sent the 90-day supply to the [facility] on [address].

## 2020-01-28 ENCOUNTER — Telehealth (INDEPENDENT_AMBULATORY_CARE_PROVIDER_SITE_OTHER): Payer: Self-pay

## 2020-01-28 MED ORDER — EMPAGLIFLOZIN 10 MG PO TABS: 10.0000 mg | ORAL_TABLET | Freq: Every day | ORAL | 1 refills | Status: DC

## 2020-01-28 NOTE — Telephone Encounter (Signed)
[facility], CMA  

## 2020-02-23 ENCOUNTER — Ambulatory Visit (INDEPENDENT_AMBULATORY_CARE_PROVIDER_SITE_OTHER): Payer: 59 | Admitting: Internal Medicine

## 2020-03-09 ENCOUNTER — Ambulatory Visit (INDEPENDENT_AMBULATORY_CARE_PROVIDER_SITE_OTHER): Payer: 59 | Admitting: Internal Medicine

## 2020-03-17 ENCOUNTER — Encounter (INDEPENDENT_AMBULATORY_CARE_PROVIDER_SITE_OTHER): Payer: Self-pay | Admitting: Internal Medicine

## 2020-03-23 ENCOUNTER — Other Ambulatory Visit (INDEPENDENT_AMBULATORY_CARE_PROVIDER_SITE_OTHER): Payer: Self-pay | Admitting: Internal Medicine

## 2020-03-23 MED ORDER — TESTOSTERONE CYPIONATE 200 MG/ML IM SOLN: 100.0000 mg | INTRAMUSCULAR | 0 refills | Status: DC

## 2020-04-27 ENCOUNTER — Other Ambulatory Visit (INDEPENDENT_AMBULATORY_CARE_PROVIDER_SITE_OTHER): Payer: Self-pay | Admitting: Internal Medicine

## 2020-04-27 ENCOUNTER — Encounter (INDEPENDENT_AMBULATORY_CARE_PROVIDER_SITE_OTHER): Payer: Self-pay | Admitting: Internal Medicine

## 2020-04-27 MED ORDER — LOSARTAN POTASSIUM-HCTZ 100-25 MG PO TABS
1.0000 | ORAL_TABLET | Freq: Every day | ORAL | 0 refills | Status: DC
Start: 2020-04-27 — End: 2020-07-20

## 2020-05-17 ENCOUNTER — Other Ambulatory Visit: Payer: Self-pay

## 2020-05-17 ENCOUNTER — Ambulatory Visit
Admission: EM | Admit: 2020-05-17 | Discharge: 2020-05-17 | Disposition: A | Discharge status: Home / Self Care | Payer: 59 | Attending: Emergency Medicine | Admitting: Emergency Medicine

## 2020-05-17 DIAGNOSIS — K644 Residual hemorrhoidal skin tags: Secondary | ICD-10-CM | POA: Diagnosis not present

## 2020-05-17 MED ORDER — HYDROCORTISONE (PERIANAL) 2.5 % EX CREA
1.0000 | TOPICAL_CREAM | Freq: Two times a day (BID) | CUTANEOUS | 0 refills | Status: DC
Start: 2020-05-17 — End: 2021-09-29

## 2020-05-17 MED ORDER — BENZOCAINE 20 % RE OINT
TOPICAL_OINTMENT | RECTAL | 0 refills | Status: DC | PRN
Start: 2020-05-17 — End: 2021-09-29

## 2020-05-17 NOTE — Discharge Instructions (Addendum)
Perform sitz water baths Eat a diet high if fiber and drink plenty of water Prescribed Anusol and benzocaine Use medication as prescribed for symptomatic relief Follow up with PCP if symptoms persists Return or go to the ER if you have any new or worsening symptoms  

## 2020-05-17 NOTE — ED Triage Notes (Signed)
Pt presents with c/o rectal pain for past few fays believes may be hemorrhoids

## 2020-05-17 NOTE — ED Triage Notes (Signed)
Pt needs covid for travel

## 2020-05-17 NOTE — ED Provider Notes (Signed)
[facility]   [id number] 05/17/20 Arrival Time: 1412   Chief Complaint  Patient presents with  . Hemorrhoids     SUBJECTIVE: History from: patient.  Joshua Chang is a 44 y.o. male with history with history of hemorrhoid presented to the urgent care with a complaint of rectal pain for the past few days.  Denies any precipitating event.  Has tried OTC medication without relief.  Denies any alleviating factors.  His symptoms are made worse with sitting.  Reports similar symptoms in the past that improved with Anusol and benzocaine.  Denies chills, fever, nausea, vomiting, diarrhea  ROS: As per HPI.  All other pertinent ROS negative.     Past Medical History:  Diagnosis Date  . Anxiety   . Diabetes mellitus without complication (HCC)   . Hypertension    Past Surgical History:  Procedure Laterality Date  . TYMPANOSTOMY TUBE PLACEMENT     No Known Allergies No current facility-administered medications on file prior to encounter.   Current Outpatient Medications on File Prior to Encounter  Medication Sig Dispense Refill  . ALPRAZolam (XANAX) 0.25 MG tablet Take 1 tablet (0.25 mg total) by mouth 2 (two) times daily as needed for anxiety. 20 tablet 0  . aspirin EC 81 MG tablet Take 81 mg by mouth daily.    . Cholecalciferol (VITAMIN D3) 125 MCG (5000 UT) TABS Take 2 tablets by mouth daily.    . empagliflozin (JARDIANCE) 10 MG TABS tablet Take 1 tablet (10 mg total) by mouth daily before breakfast. 90 tablet 1  . losartan-hydrochlorothiazide (HYZAAR) 100-25 MG tablet Take 1 tablet by mouth daily. 90 tablet 0  . metFORMIN (GLUCOPHAGE) 1000 MG tablet Take 1,000 mg by mouth 2 (two) times daily with a meal.    . testosterone cypionate (DEPO-TESTOSTERONE) 200 MG/ML injection Inject 0.5 mLs (100 mg total) into the muscle every 7 (seven) days. 10 mL 0   Social History   Socioeconomic History  . Marital status: Married    Spouse name: Not on file  . Number of children:  Not on file  . Years of education: Not on file  . Highest education level: Not on file  Occupational History  . Not on file  Tobacco Use  . Smoking status: Never Smoker  . Smokeless tobacco: Never Used  Substance and Sexual Activity  . Alcohol use: No  . Drug use: No  . Sexual activity: Not on file  Other Topics Concern  . 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   Social Determinants of Health   Financial Resource Strain:   . Difficulty of Paying Living Expenses: Not on file  Food Insecurity:   . Worried About Programme researcher, broadcasting/film/video in the Last Year: Not on file  . Ran Out of Food in the Last Year: Not on file  Transportation Needs:   . Lack of Transportation (Medical): Not on file  . Lack of Transportation (Non-Medical): Not on file  Physical Activity:   . Days of Exercise per Week: Not on file  . Minutes of Exercise per Session: Not on file  Stress:   . Feeling of Stress : Not on file  Social Connections:   . Frequency of Communication with Friends and Family: Not on file  . Frequency of Social Gatherings with Friends and Family: Not on file  . Attends Religious Services: Not on file  . Active Member of Clubs  or Organizations: Not on file  . Attends Banker Meetings: Not on file  . Marital Status: Not on file  Intimate Partner Violence:   . Fear of Current or Ex-Partner: Not on file  . Emotionally Abused: Not on file  . Physically Abused: Not on file  . Sexually Abused: Not on file   Family History  Problem Relation Age of Onset  . Depression Mother   . Hyperlipidemia Mother   . Hypertension Father   . COPD Father   . Cancer Father   . Thyroid disease Sister     OBJECTIVE:  Vitals:   05/17/20 1425  BP: (!) 182/120  Pulse: (!) 103  Resp: 18  Temp: 98.1 F (36.7 C)  SpO2: 98%     Physical Exam Vitals and nursing note reviewed.  Constitutional:      General: He  is not in acute distress.    Appearance: Normal appearance. He is normal weight. He is not ill-appearing, toxic-appearing or diaphoretic.  Cardiovascular:     Rate and Rhythm: Normal rate and regular rhythm.     Pulses: Normal pulses.     Heart sounds: Normal heart sounds. No murmur heard.  No friction rub. No gallop.   Pulmonary:     Effort: Pulmonary effort is normal. No respiratory distress.     Breath sounds: Normal breath sounds. No stridor. No wheezing, rhonchi or rales.  Chest:     Chest wall: No tenderness.  Abdominal:     General: Abdomen is flat. There is no distension.     Palpations: There is no mass.     Tenderness: There is no abdominal tenderness. There is no right CVA tenderness, left CVA tenderness, guarding or rebound.     Hernia: No hernia is present.  Genitourinary:    Rectum: External hemorrhoid present.  Neurological:     Mental Status: He is alert and oriented to person, place, and time.     LABS:  No results found for this or any previous visit (from the past 24 hour(s)).   ASSESSMENT & PLAN:  1. External hemorrhoid     Meds ordered this encounter  Medications  . hydrocortisone (ANUSOL-HC) 2.5 % rectal cream    Sig: Place 1 application rectally 2 (two) times daily.    Dispense:  30 g    Refill:  0  . benzocaine (AMERICAINE) 20 % rectal ointment    Sig: Place rectally every 3 (three) hours as needed for pain.    Dispense:  28.4 g    Refill:  0   Discharge instructions  Perform sitz water baths Eat a diet high if fiber and drink plenty of water Prescribed Anusol and benzocaine Use medication as prescribed for symptomatic relief Follow up with PCP if symptoms persists Return or go to the ER if you have any new or worsening symptoms  Reviewed expectations re: course of current medical issues. Questions answered. Outlined signs and symptoms indicating need for more acute intervention. Patient verbalized understanding. After Visit Summary  given.         Durward Parcel, FNP 05/17/20 1448

## 2020-05-18 ENCOUNTER — Other Ambulatory Visit (INDEPENDENT_AMBULATORY_CARE_PROVIDER_SITE_OTHER): Payer: Self-pay | Admitting: Internal Medicine

## 2020-05-31 ENCOUNTER — Ambulatory Visit (INDEPENDENT_AMBULATORY_CARE_PROVIDER_SITE_OTHER): Payer: 59 | Admitting: Internal Medicine

## 2020-05-31 ENCOUNTER — Other Ambulatory Visit: Payer: Self-pay

## 2020-05-31 ENCOUNTER — Encounter (INDEPENDENT_AMBULATORY_CARE_PROVIDER_SITE_OTHER): Payer: Self-pay | Admitting: Internal Medicine

## 2020-05-31 VITALS — BP 132/82 | HR 82 | Temp 97.9°F | Ht 68.0 in | Wt 320.2 lb

## 2020-05-31 DIAGNOSIS — I10 Essential (primary) hypertension: Secondary | ICD-10-CM

## 2020-05-31 DIAGNOSIS — E119 Type 2 diabetes mellitus without complications: Secondary | ICD-10-CM

## 2020-05-31 MED ORDER — METFORMIN HCL 1000 MG PO TABS: 1000.0000 mg | ORAL_TABLET | Freq: Two times a day (BID) | ORAL | 0 refills | Status: DC

## 2020-05-31 NOTE — Progress Notes (Signed)
Metrics: Intervention Frequency ACO  Documented Smoking Status Yearly  Screened one or more times in 24 months  Cessation Counseling or  Active cessation medication Past 24 months  Past 24 months   Guideline developer: UpToDate (See UpToDate for funding source) Date Released: 2014       Wellness Office Visit  Subjective:  Patient ID: Joshua Chang, male    DOB: 1976-04-24  Age: 44 y.o. MRN: [id number]  CC: This man comes in after a no-show on a cancellation and I saw him last in July.  He comes in for follow-up of morbid obesity, uncontrolled diabetes, hypertension. HPI  Overall, in the last 1 month he admits he has not been doing well with nutrition at all.  When he was doing well, he lost 17 pounds very quickly.  This was with a combination of intermittent fasting and a plant-based diet. He continues on Metformin and Jardiance for his diabetes.  His last hemoglobin A1c was 8.3%.  This was an improvement on 7 months ago when it was above 11%. He continues on Hyzaar for his hypertension. He continues on testosterone therapy as before.  He feels well on this.  He has no side effects. Past Medical History:  Diagnosis Date  . Anxiety   . Diabetes mellitus without complication (HCC)   . Hypertension    Past Surgical History:  Procedure Laterality Date  . TYMPANOSTOMY TUBE PLACEMENT       Family History  Problem Relation Age of Onset  . Depression Mother   . Hyperlipidemia Mother   . Hypertension Father   . COPD Father   . Cancer Father   . 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   Social History   Tobacco Use  . Smoking status: Never Smoker  . Smokeless tobacco: Never Used  Substance Use Topics  . Alcohol use: No    Current Meds  Medication Sig  . ALPRAZolam (XANAX) 0.25 MG tablet Take 1 tablet (0.25 mg total) by mouth 2 (two) times daily as needed  for anxiety.  Marland Kitchen aspirin EC 81 MG tablet Take 81 mg by mouth daily.  . benzocaine (AMERICAINE) 20 % rectal ointment Place rectally every 3 (three) hours as needed for pain.  . Cholecalciferol (VITAMIN D3) 125 MCG (5000 UT) TABS Take 2 tablets by mouth daily.  . empagliflozin (JARDIANCE) 10 MG TABS tablet Take 1 tablet (10 mg total) by mouth daily before breakfast.  . hydrocortisone (ANUSOL-HC) 2.5 % rectal cream Place 1 application rectally 2 (two) times daily.  Marland Kitchen losartan-hydrochlorothiazide (HYZAAR) 100-25 MG tablet Take 1 tablet by mouth daily.  . metFORMIN (GLUCOPHAGE) 1000 MG tablet Take 1 tablet (1,000 mg total) by mouth 2 (two) times daily with a meal.  . testosterone cypionate (DEPO-TESTOSTERONE) 200 MG/ML injection Inject 0.5 mLs (100 mg total) into the muscle every 7 (seven) days.  . [DISCONTINUED] metFORMIN (GLUCOPHAGE) 1000 MG tablet Take 1,000 mg by mouth 2 (two) times daily with a meal.      Depression screen [facility] 2/9 05/31/2020 10/15/2019  Decreased Interest 1 2  Down, Depressed, Hopeless 1 1  PHQ - 2 Score 2 3  Altered sleeping 2 2  Tired, decreased energy 2 3  Change in appetite 2 3  Feeling bad or failure about yourself  2 1  Trouble concentrating 1 0  Moving slowly or fidgety/restless 0 0  Suicidal thoughts  0 0  PHQ-9 Score 11 12  Difficult doing work/chores Not difficult at all -     Objective:   Today's Vitals: BP 132/82   Pulse 82   Temp 97.9 F (36.6 C) (Temporal)   Ht 5\' 8"  (1.727 m)   Wt (!) 320 lb 3.2 oz (145.2 kg)   SpO2 98%   BMI 48.69 kg/m  Vitals with BMI 05/31/2020 05/17/2020 01/12/2020  Height 5\' 8"  - 5\' 8"   Weight 320 lbs 3 oz - 325 lbs 13 oz  BMI 48.7 - 49.55  Systolic 132 182 03/14/2020  Diastolic 82 120 90  Pulse 82 103 111     Physical Exam   He looks systemically well.  He has managed to lose 5 pounds since the last time I saw him but I think he has lost more than this previously.  Blood pressure is much improved compared to last  time.    Assessment   1. Diabetes mellitus without complication (HCC)   2. Essential hypertension, benign   3. Morbid obesity (HCC)       Tests ordered Orders Placed This Encounter  Procedures  . COMPLETE METABOLIC PANEL WITH GFR  . Hemoglobin A1c     Plan: 1. He will continue with Metformin and Jardiance and I stressed the importance of not taking these medications when he is doing intermittent fasting.  He should only take these medications when he does eat. 2. He will continue with Hyzaar for hypertension. 3. He will continue to work on nutrition as we have discussed before with intermittent fasting and a plant-based diet, maintaining hydration and salt intake, especially if he does prolonged fasting. 4. Blood work is ordered.  I will see him in 3 months time for follow-up.  I did recommend he get COVID-19 booster dose.   Meds ordered this encounter  Medications  . metFORMIN (GLUCOPHAGE) 1000 MG tablet    Sig: Take 1 tablet (1,000 mg total) by mouth 2 (two) times daily with a meal.    Dispense:  180 tablet    Refill:  0    Sydelle Sherfield , MD

## 2020-07-20 ENCOUNTER — Other Ambulatory Visit (INDEPENDENT_AMBULATORY_CARE_PROVIDER_SITE_OTHER): Payer: Self-pay | Admitting: Internal Medicine

## 2020-07-21 ENCOUNTER — Encounter (INDEPENDENT_AMBULATORY_CARE_PROVIDER_SITE_OTHER): Payer: Self-pay | Admitting: Internal Medicine

## 2020-07-21 ENCOUNTER — Other Ambulatory Visit (INDEPENDENT_AMBULATORY_CARE_PROVIDER_SITE_OTHER): Payer: Self-pay | Admitting: Internal Medicine

## 2020-07-21 MED ORDER — TESTOSTERONE CYPIONATE 200 MG/ML IM SOLN: 100.0000 mg | INTRAMUSCULAR | 0 refills | Status: DC

## 2020-07-24 ENCOUNTER — Other Ambulatory Visit (INDEPENDENT_AMBULATORY_CARE_PROVIDER_SITE_OTHER): Payer: Self-pay | Admitting: Nurse Practitioner

## 2020-08-21 ENCOUNTER — Other Ambulatory Visit (INDEPENDENT_AMBULATORY_CARE_PROVIDER_SITE_OTHER): Payer: Self-pay | Admitting: Internal Medicine

## 2020-08-30 ENCOUNTER — Ambulatory Visit (INDEPENDENT_AMBULATORY_CARE_PROVIDER_SITE_OTHER): Payer: 59 | Admitting: Internal Medicine

## 2020-09-21 ENCOUNTER — Ambulatory Visit (INDEPENDENT_AMBULATORY_CARE_PROVIDER_SITE_OTHER): Payer: No Typology Code available for payment source | Admitting: Internal Medicine

## 2020-09-21 ENCOUNTER — Other Ambulatory Visit: Payer: Self-pay

## 2020-09-21 ENCOUNTER — Encounter (INDEPENDENT_AMBULATORY_CARE_PROVIDER_SITE_OTHER): Payer: Self-pay | Admitting: Internal Medicine

## 2020-09-21 VITALS — BP 150/82 | HR 82 | Temp 97.9°F | Ht 68.0 in | Wt 295.6 lb

## 2020-09-21 DIAGNOSIS — E785 Hyperlipidemia, unspecified: Secondary | ICD-10-CM | POA: Diagnosis not present

## 2020-09-21 DIAGNOSIS — E119 Type 2 diabetes mellitus without complications: Secondary | ICD-10-CM | POA: Diagnosis not present

## 2020-09-21 DIAGNOSIS — R202 Paresthesia of skin: Secondary | ICD-10-CM

## 2020-09-21 DIAGNOSIS — I10 Essential (primary) hypertension: Secondary | ICD-10-CM | POA: Diagnosis not present

## 2020-09-21 DIAGNOSIS — Z1159 Encounter for screening for other viral diseases: Secondary | ICD-10-CM

## 2020-09-21 MED ORDER — ALPRAZOLAM 0.25 MG PO TABS: 0.2500 mg | ORAL_TABLET | Freq: Two times a day (BID) | ORAL | 0 refills | Status: DC | PRN

## 2020-09-21 NOTE — Progress Notes (Signed)
Metrics: Intervention Frequency ACO  Documented Smoking Status Yearly  Screened one or more times in 24 months  Cessation Counseling or  Active cessation medication Past 24 months  Past 24 months   Guideline developer: UpToDate (See UpToDate for funding source) Date Released: 2014       Wellness Office Visit  Subjective:  Patient ID: Joshua Chang, male    DOB: 08/23/1975  Age: 45 y.o. MRN: [id number]  CC: This man comes in for follow-up of diabetes, hypertension, morbid obesity, dyslipidemia. HPI  Since last time I saw him, he is done extremely well and has managed to lose 25 pounds, focusing on his nutrition primarily and also exercising more on a regular basis. He continues only on Metformin for his diabetes.  He was unable to afford Jardiance. He continues on testosterone therapy once a week and is tolerating this well. He continues on Hyzaar for hypertension. He is also complaining of bilateral hand paresthesia but the distribution of the paresthesia does not quite fit with carpal tunnel syndrome.  He has had the symptoms for approximately 2 years and now wants a referral for further evaluation. He is also had some stress and he and his wife have now separated 2 weeks ago. Past Medical History:  Diagnosis Date  . Anxiety   . Diabetes mellitus without complication (HCC)   . Hypertension    Past Surgical History:  Procedure Laterality Date  . TYMPANOSTOMY TUBE PLACEMENT       Family History  Problem Relation Age of Onset  . Depression Mother   . Hyperlipidemia Mother   . Hypertension Father   . COPD Father   . Cancer Father   . 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   Social History   Tobacco Use  . Smoking status: Never Smoker  . Smokeless tobacco: Never Used  Substance Use Topics  . Alcohol use: No    Current Meds  Medication Sig  .  aspirin EC 81 MG tablet Take 81 mg by mouth daily.  . benzocaine (AMERICAINE) 20 % rectal ointment Place rectally every 3 (three) hours as needed for pain.  . Cholecalciferol (VITAMIN D3) 125 MCG (5000 UT) TABS Take 2 tablets by mouth daily.  . hydrocortisone (ANUSOL-HC) 2.5 % rectal cream Place 1 application rectally 2 (two) times daily.  Marland Kitchen losartan-hydrochlorothiazide (HYZAAR) 100-25 MG tablet TAKE 1 TABLET BY MOUTH EVERY DAY  . metFORMIN (GLUCOPHAGE) 1000 MG tablet TAKE 1 TABLET BY MOUTH TWICE A DAY WITH A MEAL  . testosterone cypionate (DEPO-TESTOSTERONE) 200 MG/ML injection Inject 0.5 mLs (100 mg total) into the muscle every 7 (seven) days.  . [DISCONTINUED] ALPRAZolam (XANAX) 0.25 MG tablet Take 1 tablet (0.25 mg total) by mouth 2 (two) times daily as needed for anxiety.     Flowsheet Row Office Visit from 09/21/2020 in [facility]  PHQ-9 Total Score 10      Objective:   Today's Vitals: BP (!) 150/82   Pulse 82   Temp 97.9 F (36.6 C) (Temporal)   Ht 5\' 8"  (1.727 m)   Wt 295 lb 9.6 oz (134.1 kg)   SpO2 98%   BMI 44.95 kg/m  Vitals with BMI 09/21/2020 05/31/2020 05/17/2020  Height 5\' 8"  5\' 8"  -  Weight 295 lbs 10 oz 320 lbs 3 oz -  BMI 44.96 48.7 -  Systolic 150 132 05/19/2020  Diastolic  82 82 120  Pulse 82 82 103     Physical Exam  He looks systemically well.  Although he remains morbidly obese, he has lost 25 pounds.  Blood pressure somewhat elevated today but he is stressed because of the separation with his wife.     Assessment   1. Diabetes mellitus without complication (HCC)   2. Essential hypertension, benign   3. Paresthesia of both hands   4. Dyslipidemia   5. Encounter for hepatitis C screening test for low risk patient       Tests ordered Orders Placed This Encounter  Procedures  . COMPLETE METABOLIC PANEL WITH GFR  . Hemoglobin A1c  . Lipid panel  . Hepatitis C antibody  . Ambulatory referral to Neurology     Plan: 1. Continue with  Metformin for his diabetes and we will check an A1c. 2. Continue with Hyzaar for the time being and he will monitor blood pressure at home.  We will check electrolytes. 3. Check a lipid panel. 4. I will refer him to neurology in [facility] for further evaluation of paresthesia of both his hands. 5. Further recommendations will depend on blood results and I will see him in 3 months time for follow-up.   Meds ordered this encounter  Medications  . ALPRAZolam (XANAX) 0.25 MG tablet    Sig: Take 1 tablet (0.25 mg total) by mouth 2 (two) times daily as needed for anxiety.    Dispense:  20 tablet    Refill:  0    Nimish Normajean Glasgow, MD

## 2020-09-22 ENCOUNTER — Encounter (INDEPENDENT_AMBULATORY_CARE_PROVIDER_SITE_OTHER): Payer: Self-pay | Admitting: Internal Medicine

## 2020-09-22 LAB — LIPID PANEL
Cholesterol: 186 mg/dL (ref <200)
HDL: 48 mg/dL (ref >=40)
LDL Cholesterol (Calc): 122 mg/dL (calc) — ABNORMAL HIGH
Non-HDL Cholesterol (Calc): 138 mg/dL (calc) — ABNORMAL HIGH (ref <130)
Total CHOL/HDL Ratio: 3.9 (calc) (ref <5.0)
Triglycerides: 70 mg/dL (ref <150)

## 2020-09-22 LAB — COMPLETE METABOLIC PANEL WITH GFR
AG Ratio: 1.7 (calc) (ref 1.0–2.5)
ALT: 12 U/L (ref 9–46)
AST: 13 U/L (ref 10–40)
Albumin: 4.1 g/dL (ref 3.6–5.1)
Alkaline phosphatase (APISO): 55 U/L (ref 36–130)
BUN: 16 mg/dL (ref 7–25)
CO2: 31 mmol/L (ref 20–32)
Calcium: 9.6 mg/dL (ref 8.6–10.3)
Chloride: 102 mmol/L (ref 98–110)
Creat: 1.13 mg/dL (ref 0.60–1.35)
GFR, Est African American: 92 mL/min/{1.73_m2} (ref >=60)
GFR, Est Non African American: 79 mL/min/{1.73_m2} (ref >=60)
Globulin: 2.4 g/dL (calc) (ref 1.9–3.7)
Glucose, Bld: 122 mg/dL — ABNORMAL HIGH (ref 65–99)
Potassium: 4.5 mmol/L (ref 3.5–5.3)
Sodium: 139 mmol/L (ref 135–146)
Total Bilirubin: 0.6 mg/dL (ref 0.2–1.2)
Total Protein: 6.5 g/dL (ref 6.1–8.1)

## 2020-09-22 LAB — HEMOGLOBIN A1C
Hgb A1c MFr Bld: 6 % of total Hgb — ABNORMAL HIGH (ref <5.7)
Mean Plasma Glucose: 126 mg/dL
eAG (mmol/L): 7 mmol/L

## 2020-09-22 LAB — HEPATITIS C ANTIBODY
Hepatitis C Ab: NONREACTIVE
SIGNAL TO CUT-OFF: 0.01 (ref <1.00)

## 2020-11-01 ENCOUNTER — Encounter (INDEPENDENT_AMBULATORY_CARE_PROVIDER_SITE_OTHER): Payer: Self-pay | Admitting: Internal Medicine

## 2020-11-01 ENCOUNTER — Other Ambulatory Visit (INDEPENDENT_AMBULATORY_CARE_PROVIDER_SITE_OTHER): Payer: Self-pay | Admitting: Internal Medicine

## 2020-11-01 ENCOUNTER — Other Ambulatory Visit (INDEPENDENT_AMBULATORY_CARE_PROVIDER_SITE_OTHER): Payer: Self-pay

## 2020-11-01 MED ORDER — LOSARTAN POTASSIUM-HCTZ 100-25 MG PO TABS: 1.0000 | ORAL_TABLET | Freq: Every day | ORAL | 0 refills | Status: DC

## 2020-11-30 ENCOUNTER — Other Ambulatory Visit (INDEPENDENT_AMBULATORY_CARE_PROVIDER_SITE_OTHER): Payer: Self-pay | Admitting: Internal Medicine

## 2020-11-30 ENCOUNTER — Encounter (INDEPENDENT_AMBULATORY_CARE_PROVIDER_SITE_OTHER): Payer: Self-pay | Admitting: Internal Medicine

## 2020-11-30 MED ORDER — TESTOSTERONE CYPIONATE 200 MG/ML IM SOLN: 100.0000 mg | INTRAMUSCULAR | 1 refills | Status: DC

## 2020-12-01 ENCOUNTER — Other Ambulatory Visit (INDEPENDENT_AMBULATORY_CARE_PROVIDER_SITE_OTHER): Payer: Self-pay | Admitting: Internal Medicine

## 2020-12-01 MED ORDER — TESTOSTERONE CYPIONATE 200 MG/ML IM SOLN: 100.0000 mg | INTRAMUSCULAR | 1 refills | Status: DC

## 2020-12-14 ENCOUNTER — Other Ambulatory Visit (INDEPENDENT_AMBULATORY_CARE_PROVIDER_SITE_OTHER): Payer: Self-pay | Admitting: Internal Medicine

## 2020-12-14 MED ORDER — METFORMIN HCL 1000 MG PO TABS: 1000.0000 mg | ORAL_TABLET | Freq: Two times a day (BID) | ORAL | 0 refills | Status: DC

## 2020-12-21 ENCOUNTER — Ambulatory Visit: Payer: No Typology Code available for payment source | Admitting: Neurology

## 2020-12-27 ENCOUNTER — Ambulatory Visit (INDEPENDENT_AMBULATORY_CARE_PROVIDER_SITE_OTHER): Payer: No Typology Code available for payment source | Admitting: Internal Medicine

## 2021-02-01 ENCOUNTER — Telehealth (INDEPENDENT_AMBULATORY_CARE_PROVIDER_SITE_OTHER): Payer: Self-pay

## 2021-02-01 DIAGNOSIS — I10 Essential (primary) hypertension: Secondary | ICD-10-CM

## 2021-02-01 MED ORDER — LOSARTAN POTASSIUM-HCTZ 100-25 MG PO TABS: 1.0000 | ORAL_TABLET | Freq: Every day | ORAL | 0 refills | Status: DC

## 2021-02-01 NOTE — Telephone Encounter (Signed)
Patient called and left a detailed voice message requesting a refill of the following and stated that he has been out of this for a few days:  losartan-hydrochlorothiazide (HYZAAR) 100-25 MG tablet  Last filled 11/01/2020, # 90 with 0 refills  Last OV 09/21/2020  Next OV 02/14/2021

## 2021-02-01 NOTE — Telephone Encounter (Signed)
Refill authorized and sent to [facility] pharmacy.

## 2021-02-07 ENCOUNTER — Encounter (HOSPITAL_COMMUNITY): Payer: Self-pay | Admitting: *Deleted

## 2021-02-07 ENCOUNTER — Other Ambulatory Visit: Payer: Self-pay

## 2021-02-07 ENCOUNTER — Emergency Department (HOSPITAL_COMMUNITY)
Admission: EM | Admit: 2021-02-07 | Discharge: 2021-02-08 | Disposition: A | Discharge status: Home / Self Care | Payer: No Typology Code available for payment source | Attending: Emergency Medicine | Admitting: Emergency Medicine

## 2021-02-07 DIAGNOSIS — E1140 Type 2 diabetes mellitus with diabetic neuropathy, unspecified: Secondary | ICD-10-CM | POA: Insufficient documentation

## 2021-02-07 DIAGNOSIS — R011 Cardiac murmur, unspecified: Secondary | ICD-10-CM | POA: Insufficient documentation

## 2021-02-07 DIAGNOSIS — Z7984 Long term (current) use of oral hypoglycemic drugs: Secondary | ICD-10-CM | POA: Insufficient documentation

## 2021-02-07 DIAGNOSIS — R6883 Chills (without fever): Secondary | ICD-10-CM | POA: Insufficient documentation

## 2021-02-07 DIAGNOSIS — R197 Diarrhea, unspecified: Secondary | ICD-10-CM | POA: Insufficient documentation

## 2021-02-07 DIAGNOSIS — M791 Myalgia, unspecified site: Secondary | ICD-10-CM | POA: Insufficient documentation

## 2021-02-07 DIAGNOSIS — I10 Essential (primary) hypertension: Secondary | ICD-10-CM | POA: Insufficient documentation

## 2021-02-07 DIAGNOSIS — Z20822 Contact with and (suspected) exposure to covid-19: Secondary | ICD-10-CM | POA: Insufficient documentation

## 2021-02-07 DIAGNOSIS — R110 Nausea: Secondary | ICD-10-CM | POA: Insufficient documentation

## 2021-02-07 DIAGNOSIS — Z79899 Other long term (current) drug therapy: Secondary | ICD-10-CM | POA: Insufficient documentation

## 2021-02-07 DIAGNOSIS — R5381 Other malaise: Secondary | ICD-10-CM | POA: Insufficient documentation

## 2021-02-07 DIAGNOSIS — Z7982 Long term (current) use of aspirin: Secondary | ICD-10-CM | POA: Insufficient documentation

## 2021-02-07 DIAGNOSIS — R519 Headache, unspecified: Secondary | ICD-10-CM | POA: Insufficient documentation

## 2021-02-07 LAB — CBC
HCT: 43.5 % (ref 39.0–52.0)
Hemoglobin: 14.1 g/dL (ref 13.0–17.0)
MCH: 27.3 pg (ref 26.0–34.0)
MCHC: 32.4 g/dL (ref 30.0–36.0)
MCV: 84.1 fL (ref 80.0–100.0)
Platelets: 323 10*3/uL (ref 150–400)
RBC: 5.17 MIL/uL (ref 4.22–5.81)
RDW: 15 % (ref 11.5–15.5)
WBC: 11.7 10*3/uL — ABNORMAL HIGH (ref 4.0–10.5)
nRBC: 0 % (ref 0.0–0.2)

## 2021-02-07 LAB — BASIC METABOLIC PANEL
Anion gap: 5 (ref 5–15)
BUN: 13 mg/dL (ref 6–20)
CO2: 31 mmol/L (ref 22–32)
Calcium: 9.1 mg/dL (ref 8.9–10.3)
Chloride: 100 mmol/L (ref 98–111)
Creatinine, Ser: 1.08 mg/dL (ref 0.61–1.24)
GFR, Estimated: >60 mL/min (ref >60)
Glucose, Bld: 136 mg/dL — ABNORMAL HIGH (ref 70–99)
Potassium: 3.6 mmol/L (ref 3.5–5.1)
Sodium: 136 mmol/L (ref 135–145)

## 2021-02-07 MED ORDER — ACETAMINOPHEN 325 MG PO TABS
650.0000 mg | ORAL_TABLET | Freq: Once | ORAL | Status: AC
  Administered 2021-02-07: 650 mg via ORAL
  Filled 2021-02-07: qty 2

## 2021-02-07 NOTE — ED Triage Notes (Signed)
Pt c/o generalized body aches with feeling more tired with chills and headache

## 2021-02-07 NOTE — ED Provider Notes (Signed)
[facility] EMERGENCY DEPARTMENT Provider Note   CSN: [id number] Arrival date & time: 02/07/21  2137     History Chief Complaint  Patient presents with   Generalized Body Aches    Joshua Chang is a 45 y.o. male.  HPI  Patient presented to the ED for evaluation of generalized malaise, body aches chills headache that started today.  Patient states he has had some diarrhea and some nausea.  He is not having any chest pain.  No shortness of breath.  No abdominal pain.  Patient does have history of diabetes and hypertension.  His blood sugar has been running fine.  He has been vaccinated for COVID.  No tick bites  Past Medical History:  Diagnosis Date   Anxiety    Diabetes mellitus without complication (HCC)    Hypertension     Patient Active Problem List   Diagnosis Date Noted   Heart murmur 02/07/2021   DM type 2 with diabetic peripheral neuropathy (HCC) 05/22/2014   Paresthesia of both hands 05/22/2014   Obesity 08/16/2012   Daytime somnolence 05/29/2012   Depression 05/29/2012   Dyslipidemia 05/29/2012   HTN (hypertension) 05/29/2012    Past Surgical History:  Procedure Laterality Date   TYMPANOSTOMY TUBE PLACEMENT         Family History  Problem Relation Age of Onset   Depression Mother    Hyperlipidemia Mother    Hypertension Father    COPD Father    Cancer Father    Thyroid disease Sister     Social History   Tobacco Use   Smoking status: Never   Smokeless tobacco: Never  Vaping Use   Vaping Use: Former  Substance Use Topics   Alcohol use: No   Drug use: No    Home Medications Prior to Admission medications   Medication Sig Start Date End Date Taking? Authorizing Provider  ALPRAZolam (XANAX) 0.25 MG tablet Take 1 tablet (0.25 mg total) by mouth 2 (two) times daily as needed for anxiety. 09/21/20   Wilson Singer, MD  aspirin EC 81 MG tablet Take 81 mg by mouth daily.    [provider]  benzocaine (AMERICAINE) 20 % rectal  ointment Place rectally every 3 (three) hours as needed for pain. 05/17/20   Avegno, Zachery Dakins, FNP  Cholecalciferol (VITAMIN D3) 125 MCG (5000 UT) TABS Take 2 tablets by mouth daily.    [provider]  hydrocortisone (ANUSOL-HC) 2.5 % rectal cream Place 1 application rectally 2 (two) times daily. 05/17/20   Avegno, Zachery Dakins, FNP  losartan-hydrochlorothiazide (HYZAAR) 100-25 MG tablet Take 1 tablet by mouth daily. 02/01/21   Elenore Paddy, NP  metFORMIN (GLUCOPHAGE) 1000 MG tablet Take 1 tablet (1,000 mg total) by mouth 2 (two) times daily with a meal. 12/14/20   Wilson Singer, MD  testosterone cypionate (DEPO-TESTOSTERONE) 200 MG/ML injection Inject 0.5 mLs (100 mg total) into the muscle every 7 (seven) days. 12/01/20   Wilson Singer, MD    Allergies    Patient has no known allergies.  Review of Systems   Review of Systems  All other systems reviewed and are negative.  Physical Exam Updated Vital Signs BP (!) 183/100   Pulse 78   Temp 98.7 F (37.1 C) (Oral)   Resp 18   Ht 1.727 m (5\' 8" )   Wt 121.6 kg   SpO2 100%   BMI 40.75 kg/m   Physical Exam Vitals and nursing note reviewed.  Constitutional:  General: He is not in acute distress.    Appearance: He is well-developed.  HENT:     Head: Normocephalic and atraumatic.     Right Ear: External ear normal.     Left Ear: External ear normal.  Eyes:     General: No scleral icterus.       Right eye: No discharge.        Left eye: No discharge.     Conjunctiva/sclera: Conjunctivae normal.  Neck:     Trachea: No tracheal deviation.  Cardiovascular:     Rate and Rhythm: Normal rate and regular rhythm.  Pulmonary:     Effort: Pulmonary effort is normal. No respiratory distress.     Breath sounds: Normal breath sounds. No stridor. No wheezing or rales.  Abdominal:     General: Bowel sounds are normal. There is no distension.     Palpations: Abdomen is soft.     Tenderness: There is no abdominal tenderness.  There is no guarding or rebound.  Musculoskeletal:        General: No tenderness or deformity.     Cervical back: Neck supple.  Skin:    General: Skin is warm and dry.     Findings: No rash.  Neurological:     General: No focal deficit present.     Mental Status: He is alert.     Cranial Nerves: No cranial nerve deficit (no facial droop, extraocular movements intact, no slurred speech).     Sensory: No sensory deficit.     Motor: No abnormal muscle tone or seizure activity.     Coordination: Coordination normal.  Psychiatric:        Mood and Affect: Mood normal.    ED Results / Procedures / Treatments   Labs (all labs ordered are listed, but only abnormal results are displayed) Labs Reviewed  CBC - Abnormal; Notable for the following components:      Result Value   WBC 11.7 (*)    All other components within normal limits  RESP PANEL BY RT-PCR (FLU A&B, COVID) ARPGX2  BASIC METABOLIC PANEL    EKG None  Radiology No results found.  Procedures Procedures   Medications Ordered in ED Medications  acetaminophen (TYLENOL) tablet 650 mg (650 mg Oral Given 02/07/21 2318)    ED Course  I have reviewed the triage vital signs and the nursing notes.  Pertinent labs & imaging results that were available during my care of the patient were reviewed by me and considered in my medical decision making (see chart for details).    MDM Rules/Calculators/A&P                           Patient presented to ED for evaluation of headache myalgias.  He is breathing easily here in the ED.  No abdominal tenderness.  Symptoms suggestive of possible viral etiology.  Will check basic labs considering his diabetes.  We will send off COVID test.  Anticipate discharge.  Dr Blinda Leatherwood will follow up on labs. Final Clinical Impression(s) / ED Diagnoses Final diagnoses:  Myalgia    Rx / DC Orders ED Discharge Orders     None        Linwood Dibbles, MD 02/07/21 2327

## 2021-02-08 LAB — RESP PANEL BY RT-PCR (FLU A&B, COVID) ARPGX2
Influenza A by PCR: NEGATIVE
Influenza B by PCR: NEGATIVE
SARS Coronavirus 2 by RT PCR: NEGATIVE

## 2021-02-08 NOTE — ED Notes (Signed)
Pt left without notifying staff.  

## 2021-02-10 ENCOUNTER — Telehealth (INDEPENDENT_AMBULATORY_CARE_PROVIDER_SITE_OTHER): Payer: Self-pay

## 2021-02-10 DIAGNOSIS — F419 Anxiety disorder, unspecified: Secondary | ICD-10-CM

## 2021-02-10 MED ORDER — ALPRAZOLAM 0.25 MG PO TABS: 0.2500 mg | ORAL_TABLET | Freq: Two times a day (BID) | ORAL | 0 refills | Status: DC | PRN

## 2021-02-10 NOTE — Telephone Encounter (Signed)
Patient called and left a detailed voice message that he is sorry to hear about what happened and he knows his appointment is canceled and he is asking for a refill for the following medication:  ALPRAZolam (XANAX) 0.25 MG tablet  Last filled 09/21/2020, # 20 with 0 refills  I will call patient and go over providers to call once I get your response.

## 2021-02-10 NOTE — Telephone Encounter (Signed)
Refill approved and sent to [facility] pharmacy in [place].

## 2021-02-12 LAB — GLUCOSE, POCT (MANUAL RESULT ENTRY): POC Glucose: 105 mg/dl — AB (ref 70–99)

## 2021-02-14 ENCOUNTER — Ambulatory Visit (INDEPENDENT_AMBULATORY_CARE_PROVIDER_SITE_OTHER): Payer: No Typology Code available for payment source | Admitting: Internal Medicine

## 2021-03-29 ENCOUNTER — Encounter: Payer: Self-pay | Admitting: Emergency Medicine

## 2021-03-29 ENCOUNTER — Ambulatory Visit
Admission: EM | Admit: 2021-03-29 | Discharge: 2021-03-29 | Disposition: A | Discharge status: Home / Self Care | Payer: No Typology Code available for payment source | Attending: Internal Medicine | Admitting: Internal Medicine

## 2021-03-29 ENCOUNTER — Other Ambulatory Visit: Payer: Self-pay

## 2021-03-29 DIAGNOSIS — I10 Essential (primary) hypertension: Secondary | ICD-10-CM | POA: Diagnosis not present

## 2021-03-29 MED ORDER — METFORMIN HCL 1000 MG PO TABS: 1000.0000 mg | ORAL_TABLET | Freq: Two times a day (BID) | ORAL | 1 refills | Status: DC

## 2021-03-29 MED ORDER — AMLODIPINE BESYLATE 5 MG PO TABS: 5.0000 mg | ORAL_TABLET | Freq: Every day | ORAL | 2 refills | Status: DC

## 2021-03-29 MED ORDER — LOSARTAN POTASSIUM-HCTZ 100-25 MG PO TABS
1.0000 | ORAL_TABLET | Freq: Every day | ORAL | 1 refills | Status: DC
Start: 2021-03-29 — End: 2021-09-29

## 2021-03-29 NOTE — ED Triage Notes (Signed)
States he feels like his heart is "double beating"  x a few days.  Today if feels worse.  States he feels lightheaded with some SOB.  Denis pain.

## 2021-03-29 NOTE — ED Provider Notes (Signed)
[facility]    CSN: [id number] Arrival date & time: 03/29/21  7829      History   Chief Complaint No chief complaint on file.   HPI Joshua Chang is a 45 y.o. male comes to the urgent care with a 3-day history of feeling of the heart "pounding" and double beating.  Patient says the symptoms have been intermittent over the past couple of days.  Patient denies any associated symptoms.  He has had some shortness of breath and dizziness over the past couple of days as well.  No chest pain or chest pressure.  No diaphoresis.  No faintness, near syncope or syncopal episodes.  Patient's wife tested positive for COVID-19 couple days ago.  He denies any fever or chills.  He endorses some body aches which is unchanged from previously.  No vomiting or diarrhea.  Patient's mother died of myocardial infarction at the age of 4.  Patient admits that he has been under pressure recently.  HPI  Past Medical History:  Diagnosis Date   Anxiety    Diabetes mellitus without complication (HCC)    Hypertension     Patient Active Problem List   Diagnosis Date Noted   Heart murmur 02/07/2021   DM type 2 with diabetic peripheral neuropathy (HCC) 05/22/2014   Paresthesia of both hands 05/22/2014   Obesity 08/16/2012   Daytime somnolence 05/29/2012   Depression 05/29/2012   Dyslipidemia 05/29/2012   HTN (hypertension) 05/29/2012    Past Surgical History:  Procedure Laterality Date   TYMPANOSTOMY TUBE PLACEMENT         Home Medications    Prior to Admission medications   Medication Sig Start Date End Date Taking? Authorizing Provider  amLODipine (NORVASC) 5 MG tablet Take 1 tablet (5 mg total) by mouth daily. 03/29/21  Yes Lael Pilch, Britta Mccreedy, MD  ALPRAZolam Prudy Feeler) 0.25 MG tablet Take 1 tablet (0.25 mg total) by mouth 2 (two) times daily as needed for anxiety. 02/10/21   Elenore Paddy, NP  aspirin EC 81 MG tablet Take 81 mg by mouth daily.    [provider]  benzocaine  (AMERICAINE) 20 % rectal ointment Place rectally every 3 (three) hours as needed for pain. 05/17/20   Avegno, Zachery Dakins, FNP  Cholecalciferol (VITAMIN D3) 125 MCG (5000 UT) TABS Take 2 tablets by mouth daily.    [provider]  hydrocortisone (ANUSOL-HC) 2.5 % rectal cream Place 1 application rectally 2 (two) times daily. 05/17/20   Avegno, Zachery Dakins, FNP  losartan-hydrochlorothiazide (HYZAAR) 100-25 MG tablet Take 1 tablet by mouth daily. 03/29/21   Merrilee Jansky, MD  metFORMIN (GLUCOPHAGE) 1000 MG tablet Take 1 tablet (1,000 mg total) by mouth 2 (two) times daily with a meal. 03/29/21   Daysia Vandenboom, Britta Mccreedy, MD  testosterone cypionate (DEPO-TESTOSTERONE) 200 MG/ML injection Inject 0.5 mLs (100 mg total) into the muscle every 7 (seven) days. 12/01/20   Wilson Singer, MD    Family History Family History  Problem Relation Age of Onset   Depression Mother    Hyperlipidemia Mother    Hypertension Father    COPD Father    Cancer Father    Thyroid disease Sister     Social History Social History   Tobacco Use   Smoking status: Every Day    Packs/day: 0.50    Types: Cigarettes   Smokeless tobacco: Never  Vaping Use   Vaping Use: Former  Substance Use Topics   Alcohol use: No  Drug use: No     Allergies   Patient has no known allergies.   Review of Systems Review of Systems  HENT: Negative.    Respiratory:  Positive for shortness of breath. Negative for cough, choking, chest tightness and wheezing.   Cardiovascular:  Positive for palpitations. Negative for chest pain.  Gastrointestinal: Negative.   Musculoskeletal:  Positive for myalgias.  Neurological:  Positive for light-headedness. Negative for dizziness and headaches.    Physical Exam Triage Vital Signs ED Triage Vitals  Enc Vitals Group     BP 03/29/21 0943 (!) 182/117     Pulse Rate 03/29/21 0943 100     Resp 03/29/21 0943 18     Temp 03/29/21 0943 98.4 F (36.9 C)     Temp Source 03/29/21 0943  Oral     SpO2 03/29/21 0943 97 %     Weight --      Height --      Head Circumference --      Peak Flow --      Pain Score 03/29/21 0944 0     Pain Loc --      Pain Edu? --      Excl. in GC? --    No data found.  Updated Vital Signs BP (!) 182/117 (BP Location: Right Arm)   Pulse 100   Temp 98.4 F (36.9 C) (Oral)   Resp 18   SpO2 97%   Visual Acuity Right Eye Distance:   Left Eye Distance:   Bilateral Distance:    Right Eye Near:   Left Eye Near:    Bilateral Near:     Physical Exam Vitals and nursing note reviewed.  Constitutional:      General: He is not in acute distress.    Appearance: He is not ill-appearing.  HENT:     Right Ear: Tympanic membrane normal.     Left Ear: Tympanic membrane normal.  Cardiovascular:     Rate and Rhythm: Normal rate and regular rhythm.     Pulses: Normal pulses.     Heart sounds: Normal heart sounds.  Pulmonary:     Effort: Pulmonary effort is normal.     Breath sounds: Normal breath sounds.  Abdominal:     General: Bowel sounds are normal.     Palpations: Abdomen is soft.  Musculoskeletal:        General: Normal range of motion.  Neurological:     Mental Status: He is alert.     UC Treatments / Results  Labs (all labs ordered are listed, but only abnormal results are displayed) Labs Reviewed  NOVEL CORONAVIRUS, NAA    EKG   Radiology No results found.  Procedures Procedures (including critical care time)  Medications Ordered in UC Medications - No data to display  Initial Impression / Assessment and Plan / UC Course  I have reviewed the triage vital signs and the nursing notes.  Pertinent labs & imaging results that were available during my care of the patient were reviewed by me and considered in my medical decision making (see chart for details).     1.  Uncontrolled hypertension: EKG shows T wave inversions in leads II, III and aVF.  This is unchanged from EKG done in 2014.  No acute ST/T wave  changes. Patient is advised to go to the emergency department if he develops any chest pain or chest pressure. I will add amlodipine to his treatment regimen.  Patient is currently on lisinopril/hydrochlorothiazide.  I will test patient for COVID-19 given that he has close contact with a confirmed COVID-19 case. If COVID-19 is positive patient will benefit from antivirals. Final Clinical Impressions(s) / UC Diagnoses   Final diagnoses:  Primary hypertension  Uncontrolled hypertension  Essential hypertension, benign     Discharge Instructions      Please take medications as prescribed If you have chest pain, worsening shortness of breath or dizziness please go to the emergency department immediately or call 911. We will call you with recommendations if lab results are abnormal Your medication refills have been sent to the pharmacy for pickup.   ED Prescriptions     Medication Sig Dispense Auth. Provider   metFORMIN (GLUCOPHAGE) 1000 MG tablet Take 1 tablet (1,000 mg total) by mouth 2 (two) times daily with a meal. 180 tablet Magic Mohler, Britta Mccreedy, MD   losartan-hydrochlorothiazide (HYZAAR) 100-25 MG tablet Take 1 tablet by mouth daily. 90 tablet Vash Quezada, Britta Mccreedy, MD   amLODipine (NORVASC) 5 MG tablet Take 1 tablet (5 mg total) by mouth daily. 30 tablet Tonianne Fine, Britta Mccreedy, MD      PDMP not reviewed this encounter.   Merrilee Jansky, MD 03/29/21 1021

## 2021-03-29 NOTE — Discharge Instructions (Addendum)
Please take medications as prescribed If you have chest pain, worsening shortness of breath or dizziness please go to the emergency department immediately or call 911. We will call you with recommendations if lab results are abnormal Your medication refills have been sent to the pharmacy for pickup.

## 2021-03-30 LAB — NOVEL CORONAVIRUS, NAA

## 2021-09-29 ENCOUNTER — Encounter: Payer: Self-pay | Admitting: Family Medicine

## 2021-09-29 ENCOUNTER — Ambulatory Visit (INDEPENDENT_AMBULATORY_CARE_PROVIDER_SITE_OTHER): Payer: BC Managed Care – PPO | Admitting: Family Medicine

## 2021-09-29 VITALS — BP 150/94 | HR 90 | Temp 98.1°F | Ht 68.0 in | Wt 336.8 lb

## 2021-09-29 DIAGNOSIS — I10 Essential (primary) hypertension: Secondary | ICD-10-CM

## 2021-09-29 DIAGNOSIS — E119 Type 2 diabetes mellitus without complications: Secondary | ICD-10-CM

## 2021-09-29 DIAGNOSIS — E785 Hyperlipidemia, unspecified: Secondary | ICD-10-CM | POA: Diagnosis not present

## 2021-09-29 DIAGNOSIS — F419 Anxiety disorder, unspecified: Secondary | ICD-10-CM

## 2021-09-29 DIAGNOSIS — Z79890 Hormone replacement therapy (postmenopausal): Secondary | ICD-10-CM

## 2021-09-29 DIAGNOSIS — E291 Testicular hypofunction: Secondary | ICD-10-CM

## 2021-09-29 DIAGNOSIS — Z79899 Other long term (current) drug therapy: Secondary | ICD-10-CM

## 2021-09-29 MED ORDER — METFORMIN HCL 1000 MG PO TABS: 1000.0000 mg | ORAL_TABLET | Freq: Two times a day (BID) | ORAL | 3 refills | Status: DC

## 2021-09-29 MED ORDER — ALPRAZOLAM 0.25 MG PO TABS: 0.2500 mg | ORAL_TABLET | Freq: Two times a day (BID) | ORAL | 5 refills | Status: DC | PRN

## 2021-09-29 MED ORDER — AMLODIPINE BESYLATE 5 MG PO TABS: 5.0000 mg | ORAL_TABLET | Freq: Every day | ORAL | 3 refills | Status: DC

## 2021-09-29 MED ORDER — LOSARTAN POTASSIUM-HCTZ 100-25 MG PO TABS: 1.0000 | ORAL_TABLET | Freq: Every day | ORAL | 3 refills | Status: DC

## 2021-09-29 NOTE — Assessment & Plan Note (Signed)
Uncontrolled as patient has been running out of his medication.  Medications refilled today.  Labs ordered. ?

## 2021-09-29 NOTE — Assessment & Plan Note (Signed)
Unsure of current control.  Lipid panel today. 

## 2021-09-29 NOTE — Assessment & Plan Note (Signed)
Checking testosterone.

## 2021-09-29 NOTE — Assessment & Plan Note (Signed)
Xanax refilled.  Doing okay at this time. ?

## 2021-09-29 NOTE — Assessment & Plan Note (Signed)
Unsure of current control.  A1c today.  Metformin refilled. ?

## 2021-09-29 NOTE — Patient Instructions (Signed)
Labs today. ? ?Continue medications. ? ?Follow up in 3 months. ?

## 2021-09-29 NOTE — Progress Notes (Signed)
? ?Subjective:  ?Patient ID: Joshua Chang, male    DOB: December 01, 1975  Age: 46 y.o. MRN: [id number] ? ?CC: Establish care ? ?HPI ?Joshua Chang is a 46 y.o. male presents to the clinic today to establish care. ? ?He has recently suffered a loss of his mother and has separated from his wife.  This has prompted a lot of anxiety and worsening mood.  However, he states that things seem to be improving.  Patient takes alprazolam for anxiety and is in need of refill.  Patient states that he is currently seeing a counselor. ? ?Patient's hypertension is uncontrolled.  He has been running out of his medication and has been trying to make it last.  He is prescribed amlodipine and losartan/HCTZ.  Needs refills. ? ?Patient has type 2 diabetes.  Last A1c was 6.0 but this was last year. ? ?PMH, Surgical Hx, Family Hx, Social History reviewed and updated as below.  He is on metformin.  Needs refill. ? ?Additionally, patient has a history of hypogonadism.  He has had prior low testosterone (195) on 1 occasion.  Was placed on testosterone therapy by his previous PCP.  Is currently out of this medication.  Needs labs as well as the medication. ? ?Past Medical History:  ?Diagnosis Date  ? Anxiety   ? Depression   ? Diabetes mellitus without complication ([place])   ? Hyperlipidemia   ? Hypertension   ? Hypogonadism in male   ? ? ?Past Surgical History:  ?Procedure Laterality Date  ? TYMPANOSTOMY TUBE PLACEMENT    ? ?Family History  ?Problem Relation Age of Onset  ? Depression Mother   ? Hyperlipidemia Mother   ? Hypertension Father   ? COPD Father   ? Cancer Father   ? Thyroid disease Sister   ? ?Social History  ? ?Tobacco Use  ? Smoking status: Every Day  ?  Packs/day: 0.50  ?  Types: Cigarettes  ? Smokeless tobacco: Never  ?Substance Use Topics  ? Alcohol use: No  ? ? ?Review of Systems  ?Constitutional: Negative.   ?Psychiatric/Behavioral:  The patient is nervous/anxious.   ? ?Objective:  ? ?Today's Vitals: BP (!) 150/94   Pulse  90   Temp 98.1 ?F (36.7 ?C)   Ht '5\' 8"'  (1.727 m)   Wt (!) 336 lb 12.8 oz (152.8 kg)   SpO2 98%   BMI 51.21 kg/m?  ? ?Physical Exam ?Vitals and nursing note reviewed.  ?Constitutional:   ?   Appearance: Normal appearance. He is obese.  ?HENT:  ?   Head: Normocephalic and atraumatic.  ?   Right Ear: Tympanic membrane normal.  ?   Left Ear: Tympanic membrane normal.  ?   Mouth/Throat:  ?   Pharynx: Oropharynx is clear.  ?Eyes:  ?   General:     ?   Right eye: No discharge.     ?   Left eye: No discharge.  ?   Conjunctiva/sclera: Conjunctivae normal.  ?Cardiovascular:  ?   Rate and Rhythm: Normal rate and regular rhythm.  ?   Heart sounds: No murmur heard. ?Pulmonary:  ?   Effort: Pulmonary effort is normal.  ?   Breath sounds: Normal breath sounds. No wheezing, rhonchi or rales.  ?Abdominal:  ?   General: There is no distension.  ?   Palpations: Abdomen is soft.  ?   Tenderness: There is no abdominal tenderness.  ?Neurological:  ?   Mental Status: He is alert.  ?  Psychiatric:     ?   Mood and Affect: Mood normal.     ?   Behavior: Behavior normal.  ? ? ? ?Assessment & Plan:  ? ?Problem List Items Addressed This Visit   ? ?  ? Cardiovascular and Mediastinum  ? Essential hypertension, benign - Primary  ?  Uncontrolled as patient has been running out of his medication.  Medications refilled today.  Labs ordered. ?  ?  ? Relevant Medications  ? amLODipine (NORVASC) 5 MG tablet  ? losartan-hydrochlorothiazide (HYZAAR) 100-25 MG tablet  ?  ? Endocrine  ? Type 2 diabetes mellitus without complication, without long-term current use of insulin ([place])  ?  Unsure of current control.  A1c today.  Metformin refilled. ?  ?  ? Relevant Medications  ? losartan-hydrochlorothiazide (HYZAAR) 100-25 MG tablet  ? metFORMIN (GLUCOPHAGE) 1000 MG tablet  ? Other Relevant Orders  ? CMP14+EGFR  ? Hemoglobin A1c  ? Hypogonadism in male  ?  Checking testosterone. ?  ?  ? Relevant Orders  ? Testosterone,Free and Total  ?  ? Other  ? Morbid  obesity ([place])  ? Relevant Medications  ? metFORMIN (GLUCOPHAGE) 1000 MG tablet  ? Hyperlipidemia  ?  Unsure of current control.  Lipid panel today. ?  ?  ? Relevant Medications  ? amLODipine (NORVASC) 5 MG tablet  ? losartan-hydrochlorothiazide (HYZAAR) 100-25 MG tablet  ? Other Relevant Orders  ? Lipid panel  ? Anxiety  ?  Xanax refilled.  Doing okay at this time. ?  ?  ? Relevant Medications  ? ALPRAZolam (XANAX) 0.25 MG tablet  ? ?Other Visit Diagnoses   ? ? High risk medication use      ? Relevant Orders  ? CBC  ? PSA  ? Long-term current use of testosterone replacement therapy      ? Relevant Orders  ? Testosterone,Free and Total  ? ?  ? ? ?Meds ordered this encounter  ?Medications  ? ALPRAZolam (XANAX) 0.25 MG tablet  ?  Sig: Take 1 tablet (0.25 mg total) by mouth 2 (two) times daily as needed for anxiety.  ?  Dispense:  30 tablet  ?  Refill:  5  ? amLODipine (NORVASC) 5 MG tablet  ?  Sig: Take 1 tablet (5 mg total) by mouth daily.  ?  Dispense:  90 tablet  ?  Refill:  3  ? losartan-hydrochlorothiazide (HYZAAR) 100-25 MG tablet  ?  Sig: Take 1 tablet by mouth daily.  ?  Dispense:  90 tablet  ?  Refill:  3  ? metFORMIN (GLUCOPHAGE) 1000 MG tablet  ?  Sig: Take 1 tablet (1,000 mg total) by mouth 2 (two) times daily with a meal.  ?  Dispense:  180 tablet  ?  Refill:  3  ? ? ? ?Follow-up: Return in about 3 months (around 12/30/2021). ? ?Thersa Salt DO ?[facility] ? ? ? ? ?

## 2021-10-03 LAB — CBC
Hematocrit: 42.2 % (ref 37.5–51.0)
Hemoglobin: 13.6 g/dL (ref 13.0–17.7)
MCH: 26.7 pg (ref 26.6–33.0)
MCHC: 32.2 g/dL (ref 31.5–35.7)
MCV: 83 fL (ref 79–97)
Platelets: 316 10*3/uL (ref 150–450)
RBC: 5.1 x10E6/uL (ref 4.14–5.80)
RDW: 14.2 % (ref 11.6–15.4)
WBC: 10.2 10*3/uL (ref 3.4–10.8)

## 2021-10-03 LAB — CMP14+EGFR
ALT: 24 IU/L (ref 0–44)
AST: 19 IU/L (ref 0–40)
Albumin/Globulin Ratio: 1.8 (ref 1.2–2.2)
Albumin: 4.5 g/dL (ref 4.0–5.0)
Alkaline Phosphatase: 102 IU/L (ref 44–121)
BUN/Creatinine Ratio: 16 (ref 9–20)
BUN: 18 mg/dL (ref 6–24)
Bilirubin Total: 0.4 mg/dL (ref 0.0–1.2)
CO2: 26 mmol/L (ref 20–29)
Calcium: 9.5 mg/dL (ref 8.7–10.2)
Chloride: 100 mmol/L (ref 96–106)
Creatinine, Ser: 1.12 mg/dL (ref 0.76–1.27)
Globulin, Total: 2.5 g/dL (ref 1.5–4.5)
Glucose: 194 mg/dL — ABNORMAL HIGH (ref 70–99)
Potassium: 4.2 mmol/L (ref 3.5–5.2)
Sodium: 140 mmol/L (ref 134–144)
Total Protein: 7 g/dL (ref 6.0–8.5)
eGFR: 83 mL/min/{1.73_m2} (ref >59)

## 2021-10-03 LAB — LIPID PANEL
Chol/HDL Ratio: 3.7 ratio (ref 0.0–5.0)
Cholesterol, Total: 232 mg/dL — ABNORMAL HIGH (ref 100–199)
HDL: 62 mg/dL (ref >39)
LDL Chol Calc (NIH): 157 mg/dL — ABNORMAL HIGH (ref 0–99)
Triglycerides: 78 mg/dL (ref 0–149)
VLDL Cholesterol Cal: 13 mg/dL (ref 5–40)

## 2021-10-03 LAB — HEMOGLOBIN A1C
Est. average glucose Bld gHb Est-mCnc: 200 mg/dL
Hgb A1c MFr Bld: 8.6 % — ABNORMAL HIGH (ref 4.8–5.6)

## 2021-10-03 LAB — TESTOSTERONE,FREE AND TOTAL
Testosterone, Free: 5.7 pg/mL — ABNORMAL LOW (ref 6.8–21.5)
Testosterone: 220 ng/dL — ABNORMAL LOW (ref 264–916)

## 2021-10-03 LAB — PSA: Prostate Specific Ag, Serum: 0.7 ng/mL (ref 0.0–4.0)

## 2021-10-26 ENCOUNTER — Ambulatory Visit (INDEPENDENT_AMBULATORY_CARE_PROVIDER_SITE_OTHER): Payer: BC Managed Care – PPO | Admitting: Family Medicine

## 2021-10-26 DIAGNOSIS — E119 Type 2 diabetes mellitus without complications: Secondary | ICD-10-CM | POA: Diagnosis not present

## 2021-10-26 DIAGNOSIS — E291 Testicular hypofunction: Secondary | ICD-10-CM | POA: Diagnosis not present

## 2021-10-26 DIAGNOSIS — E785 Hyperlipidemia, unspecified: Secondary | ICD-10-CM

## 2021-10-26 MED ORDER — TESTOSTERONE CYPIONATE 200 MG/ML IM SOLN: 100.0000 mg | INTRAMUSCULAR | 0 refills | Status: DC

## 2021-10-26 NOTE — Assessment & Plan Note (Signed)
Patient declines pharmacotherapy at this time.  He wants to work on lifestyle and dietary changes and reevaluate. ?

## 2021-10-26 NOTE — Patient Instructions (Signed)
Work on diet. ? ?Stay active. ? ?Follow up in 3 months. ? ?Take care ? ?Dr. Lacinda Axon  ?

## 2021-10-26 NOTE — Assessment & Plan Note (Signed)
Patient wants to continue metformin and proceed with dietary changes.  He has previously got his A1c under control with dietary and lifestyle changes.  Continue metformin.  Follow-up in 3 months. ?

## 2021-10-26 NOTE — Assessment & Plan Note (Signed)
Recommended referral to endocrinology or urology.  Patient declined and requested that I manage this.  We will restart testosterone.  Follow-up in 3 months.  Will need labs at that time. ?

## 2021-10-26 NOTE — Progress Notes (Signed)
? ?Subjective:  ?Patient ID: Joshua Chang, male    DOB: [birth year]  Age: 46 y.o. MRN: [id number] ? ?CC: ?Chief Complaint  ?Patient presents with  ? Follow-up  ?  HgbA1c and recent labs  ? ? ?HPI: ? ?46 year old male presents for follow-up to discuss recent laboratory studies. ? ?Patient has underlying type 2 diabetes.  His A1c has returned and is not at goal.  A1c 8.6.  He is currently on metformin 1000 mg twice daily.  Need to discuss treatment options today. ? ?Patient's lipids also uncontrolled.  LDL 157.  We will discuss treatment options today. ? ?Lastly, patient's testosterone level is low at 220.  He was previously on testosterone therapy.  We will discuss recommendations regarding treatment/referral/etc. ? ?Patient Active Problem List  ? Diagnosis Date Noted  ? Morbid obesity (HCC) 09/29/2021  ? Hyperlipidemia 09/29/2021  ? Anxiety 09/29/2021  ? Hypogonadism in male 09/29/2021  ? Type 2 diabetes mellitus without complication, without long-term current use of insulin (HCC) 09/29/2021  ? Depression 05/29/2012  ? Essential hypertension, benign 05/29/2012  ? ? ?Social Hx   ?Social History  ? ?Socioeconomic History  ? Marital status: Married  ?  Spouse name: Not on file  ? Number of children: Not on file  ? Years of education: Not on file  ? Highest education level: Not on file  ?Occupational History  ? Not on file  ?Tobacco Use  ? Smoking status: Every Day  ?  Packs/day: 0.50  ?  Types: Cigarettes  ? Smokeless tobacco: Never  ?Vaping Use  ? Vaping Use: Former  ?Substance and Sexual Activity  ? Alcohol use: No  ? Drug use: No  ? Sexual activity: Not on file  ?Other Topics Concern  ? Not on file  ?Social History Narrative  ? 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  ? ?Social Determinants of Health  ? ?Financial Resource Strain: Not on file  ?Food Insecurity: Not on file  ?Transportation Needs: Not on file  ?Physical Activity: Not on file  ?Stress: Not on file  ?Social  Connections: Not on file  ? ? ?Review of Systems  ?Constitutional: Negative.   ?Respiratory: Negative.    ?Cardiovascular: Negative.   ? ? ?Objective:  ?BP (!) 142/86   Ht 5\' 8"  (1.727 m)   Wt (!) 346 lb 12.8 oz (157.3 kg)   BMI 52.73 kg/m?  ? ? ?  10/26/2021  ? 10:17 AM 09/29/2021  ?  9:34 AM 03/29/2021  ?  9:43 AM  ?BP/Weight  ?Systolic BP 142 150 182  ?Diastolic BP 86 94 117  ?Wt. (Lbs) 346.8 336.8   ?BMI 52.73 kg/m2 51.21 kg/m2   ? ? ?Physical Exam ?Vitals and nursing note reviewed.  ?Constitutional:   ?   General: He is not in acute distress. ?   Appearance: Normal appearance. He is obese.  ?HENT:  ?   Head: Normocephalic and atraumatic.  ?Cardiovascular:  ?   Rate and Rhythm: Normal rate.  ?Pulmonary:  ?   Effort: Pulmonary effort is normal.  ?   Breath sounds: Normal breath sounds. No wheezing or rales.  ?Neurological:  ?   Mental Status: He is alert.  ?Psychiatric:     ?   Mood and Affect: Mood normal.     ?   Behavior: Behavior normal.  ? ? ?Lab Results  ?Component Value Date  ? WBC 10.2 09/29/2021  ? HGB 13.6 09/29/2021  ? HCT 42.2 09/29/2021  ?  PLT 316 09/29/2021  ? GLUCOSE 194 (H) 09/29/2021  ? CHOL 232 (H) 09/29/2021  ? TRIG 78 09/29/2021  ? HDL 62 09/29/2021  ? LDLCALC 157 (H) 09/29/2021  ? ALT 24 09/29/2021  ? AST 19 09/29/2021  ? NA 140 09/29/2021  ? K 4.2 09/29/2021  ? CL 100 09/29/2021  ? CREATININE 1.12 09/29/2021  ? BUN 18 09/29/2021  ? CO2 26 09/29/2021  ? TSH 1.27 10/15/2019  ? HGBA1C 8.6 (H) 09/29/2021  ? ? ? ?Assessment & Plan:  ? ?Problem List Items Addressed This Visit   ? ?  ? Endocrine  ? Hypogonadism in male  ?  Recommended referral to endocrinology or urology.  Patient declined and requested that I manage this.  We will restart testosterone.  Follow-up in 3 months.  Will need labs at that time. ? ?  ?  ? Type 2 diabetes mellitus without complication, without long-term current use of insulin (HCC)  ?  Patient wants to continue metformin and proceed with dietary changes.  He has  previously got his A1c under control with dietary and lifestyle changes.  Continue metformin.  Follow-up in 3 months. ? ?  ?  ?  ? Other  ? Hyperlipidemia  ?  Patient declines pharmacotherapy at this time.  He wants to work on lifestyle and dietary changes and reevaluate. ? ?  ?  ? ? ?Meds ordered this encounter  ?Medications  ? testosterone cypionate (DEPO-TESTOSTERONE) 200 MG/ML injection  ?  Sig: Inject 0.5 mLs (100 mg total) into the muscle every 7 (seven) days.  ?  Dispense:  10 mL  ?  Refill:  0  ? ? ?Follow-up:  Return in about 3 months (around 01/25/2022). ? ?Everlene Other DO ?[facility] ? ?

## 2021-12-30 ENCOUNTER — Encounter: Payer: Self-pay | Admitting: Family Medicine

## 2021-12-30 ENCOUNTER — Ambulatory Visit (INDEPENDENT_AMBULATORY_CARE_PROVIDER_SITE_OTHER): Payer: BC Managed Care – PPO | Admitting: Family Medicine

## 2021-12-30 VITALS — BP 159/100 | HR 94 | Temp 98.5°F | Wt 352.4 lb

## 2021-12-30 DIAGNOSIS — N529 Male erectile dysfunction, unspecified: Secondary | ICD-10-CM | POA: Insufficient documentation

## 2021-12-30 DIAGNOSIS — I10 Essential (primary) hypertension: Secondary | ICD-10-CM | POA: Diagnosis not present

## 2021-12-30 DIAGNOSIS — E291 Testicular hypofunction: Secondary | ICD-10-CM | POA: Diagnosis not present

## 2021-12-30 DIAGNOSIS — E119 Type 2 diabetes mellitus without complications: Secondary | ICD-10-CM | POA: Diagnosis not present

## 2021-12-30 DIAGNOSIS — N5201 Erectile dysfunction due to arterial insufficiency: Secondary | ICD-10-CM

## 2021-12-30 DIAGNOSIS — F419 Anxiety disorder, unspecified: Secondary | ICD-10-CM

## 2021-12-30 MED ORDER — ALPRAZOLAM 0.25 MG PO TABS: 0.2500 mg | ORAL_TABLET | Freq: Two times a day (BID) | ORAL | 3 refills | Status: DC | PRN

## 2021-12-30 MED ORDER — FREESTYLE LIBRE 3 SENSOR MISC: 3 refills | Status: DC

## 2021-12-30 MED ORDER — SILDENAFIL CITRATE 50 MG PO TABS: 50.0000 mg | ORAL_TABLET | Freq: Every day | ORAL | 0 refills | Status: AC | PRN

## 2021-12-30 MED ORDER — SEMAGLUTIDE(0.25 OR 0.5MG/DOS) 2 MG/1.5ML ~~LOC~~ SOPN: 0.2500 mg | PEN_INJECTOR | SUBCUTANEOUS | 1 refills | Status: DC

## 2021-12-30 NOTE — Assessment & Plan Note (Signed)
Trial of sildenafil  

## 2021-12-30 NOTE — Patient Instructions (Signed)
Labs midway between testosterone dosing.  Medications as prescribed.  Follow up in 3 months.   Take care  Dr. Adriana Simas

## 2021-12-30 NOTE — Assessment & Plan Note (Signed)
Continue metformin.  Adding Ozempic.  I sent in prescription for freestyle libre.  We will see if we can get this covered by his insurance.

## 2021-12-30 NOTE — Assessment & Plan Note (Signed)
Continue current medications.  We will continue to follow.

## 2021-12-30 NOTE — Progress Notes (Signed)
Subjective:  Patient ID: Joshua Chang, male    DOB: 02-26-1976  Age: 46 y.o. MRN: [id number]  CC: Chief Complaint  Patient presents with   Diabetes    Following up on DM. Pt would like to discuss different medication for DM. Pt has been having pain in legs and hands and not been able to work out. Pt would like to talk with PCP about a CGM.     HPI:  46 year old male with hypertension, hypogonadism, type 2 diabetes, morbid obesity, hyperlipidemia, anxiety presents for follow-up.  Patient is interested additional medication regarding his diabetes.  Last A1c was 8.6.  He is compliant with metformin.  He states that he knows that he needs to work on lifestyle changes more.  He would like to discuss continuous glucose meter as well.  Anxiety stable.    BP is elevated today.  He is currently on losartan/HCTZ as well as amlodipine.  Patient reports that he is struggling with erectile dysfunction.  He would like to discuss treatment options regarding this today.  Patient states that he has had some joint pain recently.  Unsure of the culprit.  He has not been working out as he normally does.  Patient Active Problem List   Diagnosis Date Noted   Erectile dysfunction 12/30/2021   Morbid obesity (HCC) 09/29/2021   Hyperlipidemia 09/29/2021   Anxiety 09/29/2021   Hypogonadism in male 09/29/2021   Type 2 diabetes mellitus without complication, without long-term current use of insulin (HCC) 09/29/2021   Depression 05/29/2012   Essential hypertension, benign 05/29/2012    Social Hx   Social History   Socioeconomic History   Marital status: Married    Spouse name: Not on file   Number of children: Not on file   Years of education: Not on file   Highest education level: Not on file  Occupational History   Not on file  Tobacco Use   Smoking status: Every Day    Packs/day: 0.50    Types: Cigarettes   Smokeless tobacco: Never  Vaping Use   Vaping Use: Former  Substance and  Sexual Activity   Alcohol use: No   Drug use: No   Sexual activity: Not on file  Other Topics Concern   Not on file  Social History Narrative   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   Social Determinants of Health   Financial Resource Strain: Not on file  Food Insecurity: Not on file  Transportation Needs: Not on file  Physical Activity: Not on file  Stress: Not on file  Social Connections: Not on file    Review of Systems Per HPI  Objective:  BP (!) 159/100   Pulse 94   Temp 98.5 F (36.9 C)   Wt (!) 352 lb 6.4 oz (159.8 kg)   SpO2 98%   BMI 53.58 kg/m      12/30/2021    8:28 AM 10/26/2021   10:17 AM 09/29/2021    9:34 AM  BP/Weight  Systolic BP 159 142 150  Diastolic BP 100 86 94  Wt. (Lbs) 352.4 346.8 336.8  BMI 53.58 kg/m2 52.73 kg/m2 51.21 kg/m2    Physical Exam Vitals and nursing note reviewed.  Constitutional:      Appearance: Normal appearance. He is obese.  Eyes:     General:        Right eye: No discharge.        Left eye: No discharge.  Conjunctiva/sclera: Conjunctivae normal.  Cardiovascular:     Rate and Rhythm: Normal rate and regular rhythm.  Pulmonary:     Effort: Pulmonary effort is normal.     Breath sounds: Normal breath sounds. No wheezing, rhonchi or rales.  Neurological:     Mental Status: He is alert.  Psychiatric:        Mood and Affect: Mood normal.        Behavior: Behavior normal.    Lab Results  Component Value Date   WBC 10.2 09/29/2021   HGB 13.6 09/29/2021   HCT 42.2 09/29/2021   PLT 316 09/29/2021   GLUCOSE 194 (H) 09/29/2021   CHOL 232 (H) 09/29/2021   TRIG 78 09/29/2021   HDL 62 09/29/2021   LDLCALC 157 (H) 09/29/2021   ALT 24 09/29/2021   AST 19 09/29/2021   NA 140 09/29/2021   K 4.2 09/29/2021   CL 100 09/29/2021   CREATININE 1.12 09/29/2021   BUN 18 09/29/2021   CO2 26 09/29/2021   TSH 1.27 10/15/2019   HGBA1C 8.6 (H) 09/29/2021     Assessment & Plan:    Problem List Items Addressed This Visit       Cardiovascular and Mediastinum   Essential hypertension, benign    Continue current medications.  We will continue to follow.      Relevant Medications   sildenafil (VIAGRA) 50 MG tablet     Endocrine   Hypogonadism in male    Patient is in need of labs to be done.  Advised him to do the labs midway through the week as he takes his testosterone on Sunday.      Relevant Orders   Testosterone   Type 2 diabetes mellitus without complication, without long-term current use of insulin (HCC) - Primary    Continue metformin.  Adding Ozempic.  I sent in prescription for freestyle libre.  We will see if we can get this covered by his insurance.      Relevant Medications   Semaglutide,0.25 or 0.5MG /DOS, 2 MG/1.5ML SOPN   Other Relevant Orders   Hemoglobin A1c     Other   Anxiety   Erectile dysfunction    Trial of sildenafil.       Meds ordered this encounter  Medications   Continuous Blood Gluc Sensor (FREESTYLE LIBRE 3 SENSOR) MISC    Sig: Use per package instructions; Change every 14 days.    Dispense:  2 each    Refill:  3   Semaglutide,0.25 or 0.5MG /DOS, 2 MG/1.5ML SOPN    Sig: Inject 0.25 mg into the skin once a week. For 4 weeks. Then increase to 0.5 mg weekly.    Dispense:  1.5 mL    Refill:  1   sildenafil (VIAGRA) 50 MG tablet    Sig: Take 1-2 tablets (50-100 mg total) by mouth daily as needed for erectile dysfunction.    Dispense:  20 tablet    Refill:  0    Follow-up: 3 months  Nollie Shiflett Adriana Simas DO [facility]

## 2021-12-30 NOTE — Assessment & Plan Note (Signed)
Patient is in need of labs to be done.  Advised him to do the labs midway through the week as he takes his testosterone on Sunday.

## 2021-12-30 NOTE — Addendum Note (Signed)
Addended by: Tommie Sams on: 12/30/2021 03:46 PM   Modules accepted: Orders

## 2022-01-02 ENCOUNTER — Encounter: Payer: Self-pay | Admitting: Family Medicine

## 2022-01-04 NOTE — Telephone Encounter (Signed)
Tommie Sams, DO       Will you send this in for me please.   Thank you   Dr. Adriana Simas

## 2022-01-04 NOTE — Telephone Encounter (Signed)
[employer] CGM system called into [facility] pharmacy

## 2022-01-19 LAB — HEMOGLOBIN A1C
Est. average glucose Bld gHb Est-mCnc: 272 mg/dL
Hgb A1c MFr Bld: 11.1 % — ABNORMAL HIGH (ref 4.8–5.6)

## 2022-01-19 LAB — TESTOSTERONE: Testosterone: 453 ng/dL (ref 264–916)

## 2022-01-19 NOTE — Addendum Note (Signed)
Addended by: Margaretha Sheffield on: 01/19/2022 03:29 PM   Modules accepted: Orders

## 2022-01-20 ENCOUNTER — Telehealth: Payer: Self-pay | Admitting: *Deleted

## 2022-01-20 NOTE — Telephone Encounter (Signed)
PA for Ozempic denied by insurance. Patient does have active referral to Endocrinology

## 2022-03-02 ENCOUNTER — Ambulatory Visit: Payer: BC Managed Care – PPO | Admitting: "Endocrinology

## 2022-03-28 ENCOUNTER — Other Ambulatory Visit: Payer: Self-pay

## 2022-03-28 MED ORDER — TESTOSTERONE CYPIONATE 200 MG/ML IM SOLN: 100.0000 mg | INTRAMUSCULAR | 0 refills | Status: DC

## 2022-03-31 ENCOUNTER — Ambulatory Visit: Payer: BC Managed Care – PPO | Admitting: Family Medicine

## 2022-04-04 ENCOUNTER — Ambulatory Visit: Payer: BC Managed Care – PPO | Admitting: Family Medicine

## 2022-11-29 ENCOUNTER — Encounter: Payer: Self-pay | Admitting: Family Medicine

## 2022-11-30 ENCOUNTER — Other Ambulatory Visit: Payer: Self-pay

## 2022-11-30 DIAGNOSIS — I10 Essential (primary) hypertension: Secondary | ICD-10-CM

## 2022-11-30 DIAGNOSIS — F419 Anxiety disorder, unspecified: Secondary | ICD-10-CM

## 2022-11-30 MED ORDER — AMLODIPINE BESYLATE 5 MG PO TABS: 5.0000 mg | ORAL_TABLET | Freq: Every day | ORAL | 3 refills | Status: DC

## 2022-11-30 MED ORDER — METFORMIN HCL 1000 MG PO TABS: 1000.0000 mg | ORAL_TABLET | Freq: Two times a day (BID) | ORAL | 3 refills | Status: DC

## 2022-11-30 MED ORDER — LOSARTAN POTASSIUM-HCTZ 100-25 MG PO TABS: 1.0000 | ORAL_TABLET | Freq: Every day | ORAL | 3 refills | Status: DC

## 2022-12-26 ENCOUNTER — Ambulatory Visit (INDEPENDENT_AMBULATORY_CARE_PROVIDER_SITE_OTHER): Payer: BC Managed Care – PPO | Admitting: Family Medicine

## 2022-12-26 ENCOUNTER — Encounter: Payer: Self-pay | Admitting: Family Medicine

## 2022-12-26 ENCOUNTER — Telehealth: Payer: Self-pay

## 2022-12-26 VITALS — BP 154/96 | HR 86 | Temp 97.9°F | Ht 68.0 in

## 2022-12-26 DIAGNOSIS — E119 Type 2 diabetes mellitus without complications: Secondary | ICD-10-CM | POA: Diagnosis not present

## 2022-12-26 DIAGNOSIS — Z7984 Long term (current) use of oral hypoglycemic drugs: Secondary | ICD-10-CM

## 2022-12-26 DIAGNOSIS — Z125 Encounter for screening for malignant neoplasm of prostate: Secondary | ICD-10-CM | POA: Diagnosis not present

## 2022-12-26 DIAGNOSIS — Z130 Encounter for screening for diseases of the blood and blood-forming organs and certain disorders involving the immune mechanism: Secondary | ICD-10-CM

## 2022-12-26 DIAGNOSIS — F419 Anxiety disorder, unspecified: Secondary | ICD-10-CM | POA: Diagnosis not present

## 2022-12-26 DIAGNOSIS — I10 Essential (primary) hypertension: Secondary | ICD-10-CM

## 2022-12-26 DIAGNOSIS — E291 Testicular hypofunction: Secondary | ICD-10-CM

## 2022-12-26 DIAGNOSIS — E785 Hyperlipidemia, unspecified: Secondary | ICD-10-CM

## 2022-12-26 MED ORDER — TESTOSTERONE CYPIONATE 200 MG/ML IM SOLN: 100.0000 mg | INTRAMUSCULAR | 0 refills | Status: DC

## 2022-12-26 MED ORDER — ALPRAZOLAM 0.25 MG PO TABS: 0.2500 mg | ORAL_TABLET | Freq: Two times a day (BID) | ORAL | 3 refills | Status: DC | PRN

## 2022-12-26 MED ORDER — AMLODIPINE BESYLATE 10 MG PO TABS: 10.0000 mg | ORAL_TABLET | Freq: Every day | ORAL | 3 refills | Status: DC

## 2022-12-26 NOTE — Patient Instructions (Signed)
Labs today.  Increase Norvasc to 10 mg daily.  Follow up in 1-3 months (pending lab results).  Take care  Dr. Adriana Simas

## 2022-12-26 NOTE — Assessment & Plan Note (Signed)
Assessment refilled.  Checking CBC and PSA.

## 2022-12-26 NOTE — Assessment & Plan Note (Signed)
Uncontrolled/not at goal.  Increasing amlodipine to 10 mg daily.

## 2022-12-26 NOTE — Telephone Encounter (Signed)
Called PT voicemail full.

## 2022-12-26 NOTE — Assessment & Plan Note (Signed)
Likely uncontrolled.  A1c today.  Continue metformin.  I anticipate patient will need additional pharmacotherapy and referral to endocrinology again.  Awaiting A1c.

## 2022-12-26 NOTE — Assessment & Plan Note (Signed)
Alprazolam refilled.  

## 2022-12-26 NOTE — Assessment & Plan Note (Addendum)
Unsure of control. Lipid panel today. 

## 2022-12-26 NOTE — Progress Notes (Signed)
Subjective:  Patient ID: Joshua Chang, male    DOB: 1975-08-12  Age: 47 y.o. MRN: [id number]  CC: Chief Complaint  Patient presents with   Diabetes    Discuss another medication other than semaglutide   Hypertension   testosterone refill    HPI:  47 year old male with hypertension, morbid obesity, uncontrolled type 2 diabetes, hypogonadism, depression, anxiety, hyperlipidemia presents for follow-up.  Patient endorses a lot of stress related to his job.  He states that he has been trying to do better and has been working out.    BP is elevated here today.  He endorses compliance with his medication.  He is on amlodipine and losartan/HCTZ.  Needs labs.  Patient requesting refill on testosterone for hypogonadism.  He is also requesting refill on his alprazolam for anxiety.  Anxiety is high at this time due to stress related to his job.  Patient has uncontrolled type 2 diabetes.  He is not taking semaglutide.  He is on metformin only at this time.  His last A1c was 11.1.  He was referred to endocrinology but was never seen.   Patient Active Problem List   Diagnosis Date Noted   Erectile dysfunction 12/30/2021   Morbid obesity (HCC) 09/29/2021   Hyperlipidemia 09/29/2021   Anxiety 09/29/2021   Hypogonadism in male 09/29/2021   Type 2 diabetes mellitus without complication, without long-term current use of insulin (HCC) 09/29/2021   Depression 05/29/2012   Essential hypertension, benign 05/29/2012    Social Hx   Social History   Socioeconomic History   Marital status: Married    Spouse name: Not on file   Number of children: Not on file   Years of education: Not on file   Highest education level: GED or equivalent  Occupational History   Not on file  Tobacco Use   Smoking status: Every Day    Packs/day: .5    Types: Cigarettes   Smokeless tobacco: Never  Vaping Use   Vaping Use: Former  Substance and Sexual Activity   Alcohol use: No   Drug use: No   Sexual  activity: Not on file  Other Topics Concern   Not on file  Social History Narrative   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   Social Determinants of Health   Financial Resource Strain: Low Risk  (12/25/2022)   Overall Financial Resource Strain (CARDIA)    Difficulty of Paying Living Expenses: Not hard at all  Food Insecurity: No Food Insecurity (12/25/2022)   Hunger Vital Sign    Worried About Running Out of Food in the Last Year: Never true    Ran Out of Food in the Last Year: Never true  Transportation Needs: No Transportation Needs (12/25/2022)   PRAPARE - Administrator, Civil Service (Medical): No    Lack of Transportation (Non-Medical): No  Physical Activity: Sufficiently Active (12/25/2022)   Exercise Vital Sign    Days of Exercise per Week: 3 days    Minutes of Exercise per Session: 60 min  Stress: Stress Concern Present (12/25/2022)   [employer] of Occupational Health - Occupational Stress Questionnaire    Feeling of Stress : Very much  Social Connections: Unknown (12/25/2022)   Social Connection and Isolation Panel [NHANES]    Frequency of Communication with Friends and Family: Patient declined    Frequency of Social Gatherings with Friends and Family: Never    Attends Religious Services: More than 4 times per year  Active Member of Clubs or Organizations: Yes    Attends Banker Meetings: More than 4 times per year    Marital Status: Married    Review of Systems  Eyes: Negative.   Respiratory: Negative.    Cardiovascular: Negative.    Objective:  BP (!) 154/96   Pulse 86   Temp 97.9 F (36.6 C)   Ht 5\' 8"  (1.727 m)   SpO2 99%   BMI 53.58 kg/m      12/26/2022    8:54 AM 12/26/2022    8:32 AM 12/30/2021    8:28 AM  BP/Weight  Systolic BP 154 160 159  Diastolic BP 96 78 100  Wt. (Lbs)   352.4  BMI   53.58 kg/m2    Physical Exam Vitals and nursing note reviewed.  Constitutional:       General: He is not in acute distress.    Appearance: Normal appearance. He is obese.  HENT:     Head: Normocephalic and atraumatic.  Eyes:     General:        Right eye: No discharge.        Left eye: No discharge.     Conjunctiva/sclera: Conjunctivae normal.  Cardiovascular:     Rate and Rhythm: Normal rate and regular rhythm.  Pulmonary:     Effort: Pulmonary effort is normal.     Breath sounds: Normal breath sounds. No wheezing, rhonchi or rales.  Neurological:     Mental Status: He is alert.  Psychiatric:     Comments: Flat affect.     Lab Results  Component Value Date   WBC 10.2 09/29/2021   HGB 13.6 09/29/2021   HCT 42.2 09/29/2021   PLT 316 09/29/2021   GLUCOSE 194 (H) 09/29/2021   CHOL 232 (H) 09/29/2021   TRIG 78 09/29/2021   HDL 62 09/29/2021   LDLCALC 157 (H) 09/29/2021   ALT 24 09/29/2021   AST 19 09/29/2021   NA 140 09/29/2021   K 4.2 09/29/2021   CL 100 09/29/2021   CREATININE 1.12 09/29/2021   BUN 18 09/29/2021   CO2 26 09/29/2021   TSH 1.27 10/15/2019   HGBA1C 11.1 (H) 01/18/2022     Assessment & Plan:   Problem List Items Addressed This Visit       Cardiovascular and Mediastinum   Essential hypertension, benign - Primary    Uncontrolled/not at goal.  Increasing amlodipine to 10 mg daily.      Relevant Medications   amLODipine (NORVASC) 10 MG tablet     Endocrine   Type 2 diabetes mellitus without complication, without long-term current use of insulin (HCC)    Likely uncontrolled.  A1c today.  Continue metformin.  I anticipate patient will need additional pharmacotherapy and referral to endocrinology again.  Awaiting A1c.      Relevant Orders   CMP14+EGFR   Hemoglobin A1c   Microalbumin / creatinine urine ratio   Hypogonadism in male    Assessment refilled.  Checking CBC and PSA.        Other   Hyperlipidemia    Unsure of control.  Lipid panel today.      Relevant Medications   amLODipine (NORVASC) 10 MG tablet   Other  Relevant Orders   Lipid panel   Anxiety    Alprazolam refilled.      Relevant Medications   ALPRAZolam (XANAX) 0.25 MG tablet   Other Visit Diagnoses     Screening for prostate cancer  Relevant Orders   PSA   Screening for deficiency anemia       Relevant Orders   CBC       Meds ordered this encounter  Medications   ALPRAZolam (XANAX) 0.25 MG tablet    Sig: Take 1 tablet (0.25 mg total) by mouth 2 (two) times daily as needed for anxiety.    Dispense:  60 tablet    Refill:  3   testosterone cypionate (DEPO-TESTOSTERONE) 200 MG/ML injection    Sig: Inject 0.5 mLs (100 mg total) into the muscle every 7 (seven) days.    Dispense:  10 mL    Refill:  0   amLODipine (NORVASC) 10 MG tablet    Sig: Take 1 tablet (10 mg total) by mouth daily.    Dispense:  90 tablet    Refill:  3    Follow-up: 1 to 3 months pending labs.  Everlene Other DO [facility]

## 2022-12-27 ENCOUNTER — Encounter: Payer: Self-pay | Admitting: Family Medicine

## 2022-12-27 LAB — MICROALBUMIN / CREATININE URINE RATIO
Creatinine, Urine: 343.4 mg/dL
Microalb/Creat Ratio: 11 mg/g creat (ref 0–29)
Microalbumin, Urine: 36.1 ug/mL

## 2022-12-27 LAB — CMP14+EGFR
ALT: 28 IU/L (ref 0–44)
AST: 24 IU/L (ref 0–40)
Albumin: 4.5 g/dL (ref 4.1–5.1)
Alkaline Phosphatase: 94 IU/L (ref 44–121)
BUN/Creatinine Ratio: 12 (ref 9–20)
BUN: 13 mg/dL (ref 6–24)
Bilirubin Total: 0.4 mg/dL (ref 0.0–1.2)
CO2: 24 mmol/L (ref 20–29)
Calcium: 10.2 mg/dL (ref 8.7–10.2)
Chloride: 101 mmol/L (ref 96–106)
Creatinine, Ser: 1.12 mg/dL (ref 0.76–1.27)
Globulin, Total: 2.8 g/dL (ref 1.5–4.5)
Glucose: 171 mg/dL — ABNORMAL HIGH (ref 70–99)
Potassium: 4.6 mmol/L (ref 3.5–5.2)
Sodium: 142 mmol/L (ref 134–144)
Total Protein: 7.3 g/dL (ref 6.0–8.5)
eGFR: 83 mL/min/{1.73_m2} (ref >59)

## 2022-12-27 LAB — CBC
Hematocrit: 45.6 % (ref 37.5–51.0)
Hemoglobin: 14.4 g/dL (ref 13.0–17.7)
MCH: 26.6 pg (ref 26.6–33.0)
MCHC: 31.6 g/dL (ref 31.5–35.7)
MCV: 84 fL (ref 79–97)
Platelets: 355 10*3/uL (ref 150–450)
RBC: 5.42 x10E6/uL (ref 4.14–5.80)
RDW: 14.7 % (ref 11.6–15.4)
WBC: 11.2 10*3/uL — ABNORMAL HIGH (ref 3.4–10.8)

## 2022-12-27 LAB — LIPID PANEL
Chol/HDL Ratio: 3.9 ratio (ref 0.0–5.0)
Cholesterol, Total: 201 mg/dL — ABNORMAL HIGH (ref 100–199)
HDL: 52 mg/dL (ref >39)
LDL Chol Calc (NIH): 134 mg/dL — ABNORMAL HIGH (ref 0–99)
Triglycerides: 82 mg/dL (ref 0–149)
VLDL Cholesterol Cal: 15 mg/dL (ref 5–40)

## 2022-12-27 LAB — HEMOGLOBIN A1C
Est. average glucose Bld gHb Est-mCnc: 180 mg/dL
Hgb A1c MFr Bld: 7.9 % — ABNORMAL HIGH (ref 4.8–5.6)

## 2022-12-27 LAB — PSA: Prostate Specific Ag, Serum: 1.5 ng/mL (ref 0.0–4.0)

## 2022-12-28 ENCOUNTER — Other Ambulatory Visit: Payer: Self-pay | Admitting: Family Medicine

## 2022-12-28 ENCOUNTER — Other Ambulatory Visit: Payer: Self-pay

## 2022-12-28 DIAGNOSIS — E291 Testicular hypofunction: Secondary | ICD-10-CM

## 2022-12-28 MED ORDER — SEMAGLUTIDE(0.25 OR 0.5MG/DOS) 2 MG/3ML ~~LOC~~ SOPN: PEN_INJECTOR | SUBCUTANEOUS | 1 refills | Status: DC

## 2022-12-28 MED ORDER — ROSUVASTATIN CALCIUM 10 MG PO TABS
10.0000 mg | ORAL_TABLET | Freq: Every day | ORAL | 3 refills | Status: DC
Start: 2022-12-28 — End: 2023-04-08

## 2023-01-09 ENCOUNTER — Ambulatory Visit: Payer: BC Managed Care – PPO | Admitting: Urology

## 2023-01-10 ENCOUNTER — Encounter: Payer: Self-pay | Admitting: Urology

## 2023-01-10 ENCOUNTER — Ambulatory Visit (INDEPENDENT_AMBULATORY_CARE_PROVIDER_SITE_OTHER): Payer: BC Managed Care – PPO | Admitting: Urology

## 2023-01-10 VITALS — BP 173/102 | HR 96 | Ht 68.0 in

## 2023-01-10 DIAGNOSIS — E1165 Type 2 diabetes mellitus with hyperglycemia: Secondary | ICD-10-CM

## 2023-01-10 DIAGNOSIS — R972 Elevated prostate specific antigen [PSA]: Secondary | ICD-10-CM | POA: Diagnosis not present

## 2023-01-10 DIAGNOSIS — E291 Testicular hypofunction: Secondary | ICD-10-CM

## 2023-01-10 NOTE — Progress Notes (Signed)
Assessment: 1. Elevated PSA   2. Hypogonadism in male      Plan: Today I had a long discussion with the patient regarding PSA and the issues and controversies regarding prostate cancer early detection.  I discussed the potential significance of his PSA rise over the past year and the need for close monitoring.  I have recommended a follow-up with repeat PSA in 4 months.  We discussed that if the trend continues that he would need further evaluation including prostate imaging and possible biopsy. We also discussed his low T along with his other issues including poorly controlled diabetes.  He requests endocrinology referral which I think would be quite appropriate.  He states that he had a referral a year ago but because of his job he could not make that appointment.  Chief Complaint: jump in psa/low T  History of Present Illness:  Joshua Chang is a 47 y.o. male with a PMHx of hypertension, morbid obesity, uncontrolled type 2 diabetes, hypogonadism, depression, anxiety, hyperlipidemia who is seen in consultation from [place], DO for evaluation of elevated psa. Patient also has hypogonadism.  Patient has been on testosterone replacement therapy for a number of years using weekly Depo-T1 100 mg.  His most recent testosterone level was 453 11 months ago. He is very pleased with his current replacement strategy and has minimal hypogonadal symptoms.  Patient has also had PSA testing the last 2 years with his PSA rising from 0.7-1.5 12/2022. No fam hx of CaP. Min LUTS.  IPSS 3   Past Medical History:  Past Medical History:  Diagnosis Date   Anxiety    Depression    Diabetes mellitus without complication (HCC)    Hyperlipidemia    Hypertension    Hypogonadism in male     Past Surgical History:  Past Surgical History:  Procedure Laterality Date   TYMPANOSTOMY TUBE PLACEMENT      Allergies:  No Known Allergies  Family History:  Family History  Problem Relation Age  of Onset   Depression Mother    Hyperlipidemia Mother    Hypertension Father    COPD Father    Cancer Father    Thyroid disease Sister     Social History:  Social History   Tobacco Use   Smoking status: Every Day    Packs/day: .5    Types: Cigarettes   Smokeless tobacco: Never  Vaping Use   Vaping Use: Former  Substance Use Topics   Alcohol use: No   Drug use: No    Review of symptoms:  Constitutional:  Negative for unexplained weight loss, night sweats, fever, chills ENT:  Negative for nose bleeds, sinus pain, painful swallowing CV:  Negative for chest pain, shortness of breath, exercise intolerance, palpitations, loss of consciousness Resp:  Negative for cough, wheezing, shortness of breath GI:  Negative for nausea, vomiting, diarrhea, bloody stools GU:  Positives noted in HPI; otherwise negative for gross hematuria, dysuria, urinary incontinence Neuro:  Negative for seizures, poor balance, limb weakness, slurred speech Psych:  Negative for lack of energy, depression, anxiety Endocrine:  Negative for polydipsia, polyuria, symptoms of hypoglycemia (dizziness, hunger, sweating) Hematologic:  Negative for anemia, purpura, petechia, prolonged or excessive bleeding, use of anticoagulants  Allergic:  Negative for difficulty breathing or choking as a result of exposure to anything; no shellfish allergy; no allergic response (rash/itch) to materials, foods  Physical exam: BP (!) 161/97   Pulse (!) 101   Ht 5\' 8"  (1.727 m)  BMI 53.58 kg/m  GENERAL APPEARANCE:  Well appearing, well developed, well nourished, NAD  GU: Normal external genitalia DRE: Normal sphincter tone; prostate is small approximately 20 g without evidence of nodules or induration

## 2023-01-16 NOTE — Telephone Encounter (Signed)
Unfortunately that is the preferred medication and it did received authorization from insurance - that is his portion under his insurance with approval.

## 2023-01-16 NOTE — Telephone Encounter (Signed)
Joshua Sams, DO    Is there another GLP1 they will cover ?

## 2023-01-24 ENCOUNTER — Encounter: Payer: Self-pay | Admitting: Family Medicine

## 2023-03-09 ENCOUNTER — Encounter: Payer: Self-pay | Admitting: Pharmacist

## 2023-03-14 ENCOUNTER — Ambulatory Visit: Payer: BC Managed Care – PPO | Admitting: Family Medicine

## 2023-03-16 ENCOUNTER — Encounter: Payer: Self-pay | Admitting: Pharmacist

## 2023-03-21 ENCOUNTER — Ambulatory Visit: Payer: BC Managed Care – PPO | Admitting: Family Medicine

## 2023-03-21 ENCOUNTER — Encounter: Payer: Self-pay | Admitting: Pharmacist

## 2023-03-27 ENCOUNTER — Ambulatory Visit (INDEPENDENT_AMBULATORY_CARE_PROVIDER_SITE_OTHER): Payer: BC Managed Care – PPO | Admitting: Family Medicine

## 2023-03-27 VITALS — BP 139/88 | HR 85 | Temp 97.9°F | Ht 68.0 in | Wt 326.2 lb

## 2023-03-27 DIAGNOSIS — Z23 Encounter for immunization: Secondary | ICD-10-CM

## 2023-03-27 DIAGNOSIS — M25541 Pain in joints of right hand: Secondary | ICD-10-CM | POA: Diagnosis not present

## 2023-03-27 DIAGNOSIS — E785 Hyperlipidemia, unspecified: Secondary | ICD-10-CM | POA: Diagnosis not present

## 2023-03-27 DIAGNOSIS — G4700 Insomnia, unspecified: Secondary | ICD-10-CM

## 2023-03-27 DIAGNOSIS — E119 Type 2 diabetes mellitus without complications: Secondary | ICD-10-CM

## 2023-03-27 DIAGNOSIS — Z7984 Long term (current) use of oral hypoglycemic drugs: Secondary | ICD-10-CM

## 2023-03-27 MED ORDER — TRAZODONE HCL 50 MG PO TABS: 50.0000 mg | ORAL_TABLET | Freq: Every evening | ORAL | 1 refills | Status: AC | PRN

## 2023-03-27 MED ORDER — MELOXICAM 15 MG PO TABS: 15.0000 mg | ORAL_TABLET | Freq: Every day | ORAL | 1 refills | Status: DC | PRN

## 2023-03-27 NOTE — Assessment & Plan Note (Signed)
Trial of trazodone.

## 2023-03-27 NOTE — Progress Notes (Signed)
Subjective:  Patient ID: Joshua Chang, male    DOB: 1976-06-28  Age: 47 y.o. MRN: [id number]  CC: Follow-up   HPI:  47 year old male with hypertension, type 2 diabetes, hypogonadism, depression and anxiety, morbid obesity, hyperlipidemia presents for follow-up.  Hypertension stable on amlodipine and losartan/HCTZ.  Has recently been seen by urology.  Urology recommended referral to endocrinology regarding uncontrolled diabetes.  Referral was sent in.  Urology monitoring PSA given recent doubling.  Patient's diabetes is uncontrolled.  Started semaglutide but patient could not afford.  He is currently on metformin.  Patient reports that he is having ongoing pain of his right index finger.  States that this has been going on for the past 2 months.  He states that it swollen at times.  Patient also reports difficulty sleeping.  He has had insomnia for the past 6 months.  Difficulty falling asleep and staying asleep.  Patient Active Problem List   Diagnosis Date Noted   Arthralgia of right hand 03/27/2023   Insomnia 03/27/2023   Erectile dysfunction 12/30/2021   Morbid obesity (HCC) 09/29/2021   Hyperlipidemia 09/29/2021   Anxiety 09/29/2021   Hypogonadism in male 09/29/2021   Type 2 diabetes mellitus without complication, without long-term current use of insulin (HCC) 09/29/2021   Depression 05/29/2012   Essential hypertension, benign 05/29/2012    Social Hx   Social History   Socioeconomic History   Marital status: Married    Spouse name: Not on file   Number of children: Not on file   Years of education: Not on file   Highest education level: GED or equivalent  Occupational History   Not on file  Tobacco Use   Smoking status: Every Day    Current packs/day: 0.50    Types: Cigarettes   Smokeless tobacco: Never  Vaping Use   Vaping status: Former  Substance and Sexual Activity   Alcohol use: No   Drug use: No   Sexual activity: Not on file  Other Topics  Concern   Not on file  Social History Narrative   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   Social Determinants of Health   Financial Resource Strain: Low Risk  (12/25/2022)   Overall Financial Resource Strain (CARDIA)    Difficulty of Paying Living Expenses: Not hard at all  Food Insecurity: No Food Insecurity (12/25/2022)   Hunger Vital Sign    Worried About Running Out of Food in the Last Year: Never true    Ran Out of Food in the Last Year: Never true  Transportation Needs: No Transportation Needs (12/25/2022)   PRAPARE - Administrator, Civil Service (Medical): No    Lack of Transportation (Non-Medical): No  Physical Activity: Sufficiently Active (12/25/2022)   Exercise Vital Sign    Days of Exercise per Week: 3 days    Minutes of Exercise per Session: 60 min  Stress: Stress Concern Present (12/25/2022)   [employer] of Occupational Health - Occupational Stress Questionnaire    Feeling of Stress : Very much  Social Connections: Unknown (12/25/2022)   Social Connection and Isolation Panel [NHANES]    Frequency of Communication with Friends and Family: Patient declined    Frequency of Social Gatherings with Friends and Family: Never    Attends Religious Services: More than 4 times per year    Active Member of [employer] or Organizations: Yes    Attends Banker Meetings: More than 4 times per year  Marital Status: Married    Review of Systems Per HPI  Objective:  BP 139/88   Pulse 85   Temp 97.9 F (36.6 C) (Oral)   Ht 5\' 8"  (1.727 m)   Wt (!) 326 lb 3.2 oz (148 kg)   SpO2 99%   BMI 49.60 kg/m      03/27/2023    8:33 AM 01/10/2023   10:49 AM 12/26/2022    8:54 AM  BP/Weight  Systolic BP 139 173 154  Diastolic BP 88 102 96  Wt. (Lbs) 326.2    BMI 49.6 kg/m2      Physical Exam Vitals and nursing note reviewed.  Constitutional:      General: He is not in acute distress.    Appearance: Normal  appearance.  HENT:     Head: Normocephalic and atraumatic.  Eyes:     General:        Right eye: No discharge.        Left eye: No discharge.     Conjunctiva/sclera: Conjunctivae normal.  Cardiovascular:     Rate and Rhythm: Normal rate and regular rhythm.  Pulmonary:     Effort: Pulmonary effort is normal.     Breath sounds: Normal breath sounds. No wheezing, rhonchi or rales.  Neurological:     Mental Status: He is alert.     Lab Results  Component Value Date   WBC 11.2 (H) 12/26/2022   HGB 14.4 12/26/2022   HCT 45.6 12/26/2022   PLT 355 12/26/2022   GLUCOSE 171 (H) 12/26/2022   CHOL 201 (H) 12/26/2022   TRIG 82 12/26/2022   HDL 52 12/26/2022   LDLCALC 134 (H) 12/26/2022   ALT 28 12/26/2022   AST 24 12/26/2022   NA 142 12/26/2022   K 4.6 12/26/2022   CL 101 12/26/2022   CREATININE 1.12 12/26/2022   BUN 13 12/26/2022   CO2 24 12/26/2022   TSH 1.27 10/15/2019   HGBA1C 7.9 (H) 12/26/2022     Assessment & Plan:   Problem List Items Addressed This Visit       Endocrine   Type 2 diabetes mellitus without complication, without long-term current use of insulin (HCC) - Primary    Continue metformin.  Patient will get his labs drawn in the next few days for updated A1c.  His insurance covered GLP-1 but it was too cost prohibitive.  Has upcoming appointment with Endo.      Relevant Orders   Hemoglobin A1c     Other   Insomnia    Trial of trazodone.      Hyperlipidemia    Rechecking lipids.      Relevant Orders   Lipid panel   Arthralgia of right hand    Labs for further evaluation. Starting meloxicam.      Relevant Orders   Sedimentation Rate   ANA   CYCLIC CITRUL PEPTIDE ANTIBODY, IGG/IGA   Rheumatoid factor   Other Visit Diagnoses     Needs flu shot       Relevant Orders   Flu vaccine trivalent PF, 6mos and older(Flulaval,Afluria,Fluarix,Fluzone) (Completed)       Meds ordered this encounter  Medications   meloxicam (MOBIC) 15 MG tablet     Sig: Take 1 tablet (15 mg total) by mouth daily as needed for pain.    Dispense:  30 tablet    Refill:  1   traZODone (DESYREL) 50 MG tablet    Sig: Take 1 tablet (50 mg total) by  mouth at bedtime as needed for sleep.    Dispense:  90 tablet    Refill:  1    Follow-up:  3-6 months  Zaliah Wissner Adriana Simas DO [facility]

## 2023-03-27 NOTE — Assessment & Plan Note (Signed)
Continue metformin.  Patient will get his labs drawn in the next few days for updated A1c.  His insurance covered GLP-1 but it was too cost prohibitive.  Has upcoming appointment with Endo.

## 2023-03-27 NOTE — Assessment & Plan Note (Signed)
Rechecking lipids.

## 2023-03-27 NOTE — Assessment & Plan Note (Signed)
Labs for further evaluation. Starting meloxicam.

## 2023-03-27 NOTE — Patient Instructions (Signed)
Labs after 9/25.  Medications as prescribed.  Follow up in 3-6 months.

## 2023-04-03 LAB — LIPID PANEL
LDL Cholesterol: 135
Triglycerides: 47 (ref 40–160)

## 2023-04-04 NOTE — Patient Instructions (Signed)

## 2023-04-05 ENCOUNTER — Ambulatory Visit (INDEPENDENT_AMBULATORY_CARE_PROVIDER_SITE_OTHER): Payer: BC Managed Care – PPO | Admitting: Nurse Practitioner

## 2023-04-05 ENCOUNTER — Encounter: Payer: Self-pay | Admitting: Nurse Practitioner

## 2023-04-05 VITALS — BP 117/74 | HR 79 | Ht 68.0 in | Wt 331.0 lb

## 2023-04-05 DIAGNOSIS — Z7984 Long term (current) use of oral hypoglycemic drugs: Secondary | ICD-10-CM

## 2023-04-05 DIAGNOSIS — E1165 Type 2 diabetes mellitus with hyperglycemia: Secondary | ICD-10-CM | POA: Diagnosis not present

## 2023-04-05 DIAGNOSIS — E782 Mixed hyperlipidemia: Secondary | ICD-10-CM

## 2023-04-05 LAB — ANA: Anti Nuclear Antibody (ANA): NEGATIVE

## 2023-04-05 LAB — HEMOGLOBIN A1C
Est. average glucose Bld gHb Est-mCnc: 151 mg/dL
Hgb A1c MFr Bld: 6.9 % — ABNORMAL HIGH (ref 4.8–5.6)

## 2023-04-05 LAB — LIPID PANEL
Chol/HDL Ratio: 3.4 {ratio} (ref 0.0–5.0)
Cholesterol, Total: 205 mg/dL — ABNORMAL HIGH (ref 100–199)
HDL: 61 mg/dL (ref >39)
LDL Chol Calc (NIH): 135 mg/dL — ABNORMAL HIGH (ref 0–99)
Triglycerides: 47 mg/dL (ref 0–149)
VLDL Cholesterol Cal: 9 mg/dL (ref 5–40)

## 2023-04-05 LAB — RHEUMATOID FACTOR: Rheumatoid fact SerPl-aCnc: <10 [IU]/mL (ref <14.0)

## 2023-04-05 LAB — CYCLIC CITRUL PEPTIDE ANTIBODY, IGG/IGA: Cyclic Citrullin Peptide Ab: 5 U (ref 0–19)

## 2023-04-05 LAB — SEDIMENTATION RATE: Sed Rate: 3 mm/h (ref 0–15)

## 2023-04-05 NOTE — Progress Notes (Signed)
Endocrinology Consult Note       04/05/2023, 10:16 AM   Subjective:    Patient ID: Joshua Chang, male    DOB: 01-30-1976.  Joshua Chang is being seen in consultation for management of currently uncontrolled symptomatic diabetes requested by  Tommie Sams, DO.   Past Medical History:  Diagnosis Date   Anxiety    Depression    Diabetes mellitus without complication (HCC)    Hyperlipidemia    Hypertension    Hypogonadism in male     Past Surgical History:  Procedure Laterality Date   TYMPANOSTOMY TUBE PLACEMENT      Social History   Socioeconomic History   Marital status: Married    Spouse name: Not on file   Number of children: Not on file   Years of education: Not on file   Highest education level: GED or equivalent  Occupational History   Not on file  Tobacco Use   Smoking status: Every Day    Current packs/day: 0.50    Types: Cigarettes   Smokeless tobacco: Never  Vaping Use   Vaping status: Former  Substance and Sexual Activity   Alcohol use: No   Drug use: No   Sexual activity: Not on file  Other Topics Concern   Not on file  Social History Narrative   Married for 15 years.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   Social Determinants of Health   Financial Resource Strain: Low Risk  (12/25/2022)   Overall Financial Resource Strain (CARDIA)    Difficulty of Paying Living Expenses: Not hard at all  Food Insecurity: No Food Insecurity (12/25/2022)   Hunger Vital Sign    Worried About Running Out of Food in the Last Year: Never true    Ran Out of Food in the Last Year: Never true  Transportation Needs: No Transportation Needs (12/25/2022)   PRAPARE - Administrator, Civil Service (Medical): No    Lack of Transportation (Non-Medical): No  Physical Activity: Sufficiently Active (12/25/2022)   Exercise Vital Sign    Days of Exercise per Week: 3 days     Minutes of Exercise per Session: 60 min  Stress: Stress Concern Present (12/25/2022)   [employer] of Occupational Health - Occupational Stress Questionnaire    Feeling of Stress : Very much  Social Connections: Unknown (12/25/2022)   Social Connection and Isolation Panel [NHANES]    Frequency of Communication with Friends and Family: Patient declined    Frequency of Social Gatherings with Friends and Family: Never    Attends Religious Services: More than 4 times per year    Active Member of [employer] or Organizations: Yes    Attends Engineer, structural: More than 4 times per year    Marital Status: Married    Family History  Problem Relation Age of Onset   Depression Mother    Hyperlipidemia Mother    Hypertension Father    COPD Father    Cancer Father    Thyroid disease Sister     Outpatient Encounter Medications as of 04/05/2023  Medication Sig   ALPRAZolam (XANAX) 0.25 MG tablet Take 1 tablet (0.25 mg total)  by mouth 2 (two) times daily as needed for anxiety.   amLODipine (NORVASC) 10 MG tablet Take 1 tablet (10 mg total) by mouth daily.   aspirin EC 81 MG tablet Take 81 mg by mouth daily. Swallow whole.   Cholecalciferol (VITAMIN D3) 125 MCG (5000 UT) TABS Take 2 tablets by mouth daily.   losartan-hydrochlorothiazide (HYZAAR) 100-25 MG tablet Take 1 tablet by mouth daily.   meloxicam (MOBIC) 15 MG tablet Take 1 tablet (15 mg total) by mouth daily as needed for pain.   metFORMIN (GLUCOPHAGE) 1000 MG tablet Take 1 tablet (1,000 mg total) by mouth 2 (two) times daily with a meal.   rosuvastatin (CRESTOR) 10 MG tablet Take 1 tablet (10 mg total) by mouth daily.   sildenafil (VIAGRA) 50 MG tablet Take 1-2 tablets (50-100 mg total) by mouth daily as needed for erectile dysfunction.   testosterone cypionate (DEPO-TESTOSTERONE) 200 MG/ML injection Inject 0.5 mLs (100 mg total) into the muscle every 7 (seven) days.   traZODone (DESYREL) 50 MG tablet Take 1 tablet (50 mg  total) by mouth at bedtime as needed for sleep.   [DISCONTINUED] Semaglutide,0.25 or 0.5MG /DOS, 2 MG/3ML SOPN 0.25 mg weekly for 4 weeks, then increase to 5 mg weekly.   No facility-administered encounter medications on file as of 04/05/2023.    ALLERGIES: No Known Allergies  VACCINATION STATUS: Immunization History  Administered Date(s) Administered   Influenza, Seasonal, Injecte, Preservative Fre 03/27/2023   Influenza,inj,Quad PF,6+ Mos 03/15/2019, 03/21/2020   Moderna Sars-Covid-2 Vaccination 09/13/2019, 10/15/2019, 08/06/2020    Diabetes He presents for his initial diabetic visit. He has type 2 diabetes mellitus. Onset time: Diagnosed at approx age of 47. His disease course has been improving. There are no hypoglycemic associated symptoms. There are no diabetic associated symptoms. There are no hypoglycemic complications. Symptoms are stable. Diabetic complications include impotence. Risk factors for coronary artery disease include diabetes mellitus, dyslipidemia, family history, male sex, obesity and sedentary lifestyle. Current diabetic treatment includes oral agent (monotherapy). He is compliant with treatment most of the time. His weight is decreasing steadily. He is following a generally unhealthy diet. When asked about meal planning, he reported none. He has not had a previous visit with a dietitian. He participates in exercise intermittently. (He presents today for his consultation with no meter or logs to review.  He does not monitor glucose as often as he should (only once every 2-3 weeks).  His previsit A1c on 10/1 was 6.9%, improving from previous A1c.  He drinks mostly water (with SF flavoring packets), diet drinks, or SF drinks.  He eats 2-3 meals per day (skipping lunch most days) and snacks some.  He just recently started to incorporate more physical exercise into his daily routine.  He has eye appointment coming up and has seen podiatry in the distant past.  He is a former  patient of Dr. Karilyn Cota and notes he did well with lifestyle modification for diabetes management until his mother passed away and he fell off his routine.) An ACE inhibitor/angiotensin II receptor blocker is being taken. He does not see a podiatrist.Eye exam is current.     Review of systems  Constitutional: + decreasing body weight, current Body mass index is 50.33 kg/m., no fatigue, no subjective hyperthermia, no subjective hypothermia Eyes: no blurry vision, no xerophthalmia ENT: no sore throat, no nodules palpated in throat, no dysphagia/odynophagia, no hoarseness Cardiovascular: no chest pain, no shortness of breath, no palpitations, no leg swelling Respiratory: no cough, no shortness  of breath Gastrointestinal: no nausea/vomiting/diarrhea Musculoskeletal: no muscle/joint aches Skin: no rashes, no hyperemia Neurological: no tremors, no numbness, no tingling, no dizziness Psychiatric: no depression, no anxiety  Objective:     BP 117/74 (BP Location: Left Arm, Patient Position: Sitting, Cuff Size: Large)   Pulse 79   Ht 5\' 8"  (1.727 m)   Wt (!) 331 lb (150.1 kg)   BMI 50.33 kg/m   Wt Readings from Last 3 Encounters:  04/05/23 (!) 331 lb (150.1 kg)  03/27/23 (!) 326 lb 3.2 oz (148 kg)  12/30/21 (!) 352 lb 6.4 oz (159.8 kg)     BP Readings from Last 3 Encounters:  04/05/23 117/74  03/27/23 139/88  01/10/23 (!) 173/102     Physical Exam- Limited  Constitutional:  Body mass index is 50.33 kg/m. , not in acute distress, normal state of mind Eyes:  EOMI, no exophthalmos Neck: Supple Cardiovascular: RRR, no murmurs, rubs, or gallops, no edema Respiratory: Adequate breathing efforts, no crackles, rales, rhonchi, or wheezing Musculoskeletal: no gross deformities, strength intact in all four extremities, no gross restriction of joint movements Skin:  no rashes, no hyperemia Neurological: no tremor with outstretched hands   Diabetic Foot Exam - Simple   No data filed      CMP ( most recent) CMP     Component Value Date/Time   NA 142 12/26/2022 0911   K 4.6 12/26/2022 0911   CL 101 12/26/2022 0911   CO2 24 12/26/2022 0911   GLUCOSE 171 (H) 12/26/2022 0911   GLUCOSE 136 (H) 02/07/2021 2240   BUN 13 12/26/2022 0911   CREATININE 1.12 12/26/2022 0911   CREATININE 1.13 09/21/2020 0827   CALCIUM 10.2 12/26/2022 0911   PROT 7.3 12/26/2022 0911   ALBUMIN 4.5 12/26/2022 0911   AST 24 12/26/2022 0911   ALT 28 12/26/2022 0911   ALKPHOS 94 12/26/2022 0911   BILITOT 0.4 12/26/2022 0911   EGFR 83 12/26/2022 0911   GFRNONAA >60 02/07/2021 2240   GFRNONAA 79 09/21/2020 0827     Diabetic Labs (most recent): Lab Results  Component Value Date   HGBA1C 6.9 (H) 04/03/2023   HGBA1C 7.9 (H) 12/26/2022   HGBA1C 11.1 (H) 01/18/2022     Lipid Panel ( most recent) Lipid Panel     Component Value Date/Time   CHOL 205 (H) 04/03/2023 1115   TRIG 47 04/03/2023 1115   HDL 61 04/03/2023 1115   CHOLHDL 3.4 04/03/2023 1115   CHOLHDL 3.9 09/21/2020 0827   LDLCALC 135 (H) 04/03/2023 1115   LDLCALC 122 (H) 09/21/2020 0827   LABVLDL 9 04/03/2023 1115      Lab Results  Component Value Date   TSH 1.27 10/15/2019           Assessment & Plan:   1) Type 2 diabetes mellitus with hyperglycemia, without long-term current use of insulin (HCC)  He presents today for his consultation with no meter or logs to review.  He does not monitor glucose as often as he should (only once every 2-3 weeks).  His previsit A1c on 10/1 was 6.9%, improving from previous A1c.  He drinks mostly water (with SF flavoring packets), diet drinks, or SF drinks.  He eats 2-3 meals per day (skipping lunch most days) and snacks some.  He just recently started to incorporate more physical exercise into his daily routine.  He has eye appointment coming up and has seen podiatry in the distant past.  He is a former patient of Dr.  Gosrani and notes he did well with lifestyle modification for  diabetes management until his mother passed away and he fell off his routine.  - Jmichael Chang has currently uncontrolled symptomatic type 2 DM since 47 years of age, with most recent A1c of 6.9 %.   -Recent labs reviewed.  - I had a long discussion with him about the progressive nature of diabetes and the pathology behind its complications. -his diabetes is complicated by ED and he remains at a high risk for more acute and chronic complications which include CAD, CVA, CKD, retinopathy, and neuropathy. These are all discussed in detail with him.  The following Lifestyle Medicine recommendations according to American College of Lifestyle Medicine [facility]) were discussed and offered to patient and he agrees to start the journey:  A. Whole Foods, Plant-based plate comprising of fruits and vegetables, plant-based proteins, whole-grain carbohydrates was discussed in detail with the patient.   A list for source of those nutrients were also provided to the patient.  Patient will use only water or unsweetened tea for hydration. B.  The need to stay away from risky substances including alcohol, smoking; obtaining 7 to 9 hours of restorative sleep, at least 150 minutes of moderate intensity exercise weekly, the importance of healthy social connections,  and stress reduction techniques were discussed. C.  A full color page of  Calorie density of various food groups per pound showing examples of each food groups was provided to the patient.  - I have counseled him on diet and weight management by adopting a carbohydrate restricted/protein rich diet. Patient is encouraged to switch to unprocessed or minimally processed complex starch and increased protein intake (animal or plant source), fruits, and vegetables. -  he is advised to stick to a routine mealtimes to eat 3 meals a day and avoid unnecessary snacks (to snack only to correct hypoglycemia).   - he acknowledges that there is a room for improvement in  his food and drink choices. - Suggestion is made for him to avoid simple carbohydrates from his diet including Cakes, Sweet Desserts, Ice Cream, Soda (diet and regular), Sweet Tea, Candies, Chips, Cookies, Store Bought Juices, Alcohol in Excess of 1-2 drinks a day, Artificial Sweeteners, Coffee Creamer, and "Sugar-free" Products. This will help patient to have more stable blood glucose profile and potentially avoid unintended weight gain.  - I have approached him with the following individualized plan to manage his diabetes and patient agrees:    -he is encouraged to start monitoring glucose once daily, before breakfast, to log their readings on the clinic sheets provided, and bring them to review at follow up appointment in 3 months.  - Adjustment parameters are given to him for hypo and hyperglycemia in writing. - he is encouraged to call clinic for blood glucose levels less than 70 or above 300 mg /dl. - he is advised to continue Metformin 1000 mg po twice daily after meals, therapeutically suitable for patient .  He seems really motivated to do better without medications.  - he will be considered for incretin therapy as appropriate next visit.  He was prescribed Ozempic in the past but the copay was too expensive.  - Specific targets for  A1c; LDL, HDL, and Triglycerides were discussed with the patient.  2) Blood Pressure /Hypertension:  his blood pressure is controlled to target.   he is advised to continue his current medications including Amlodipine 10 mg p.o. daily with breakfast, and Losartan/HCT 100-25 mg po daily.  3) Lipids/Hyperlipidemia:    Review of his recent lipid panel from 04/03/23 showed uncontrolled LDL at 135 .  he is advised to continue Crestor 10 mg daily at bedtime.  Side effects and precautions discussed with him.  4)  Weight/Diet:  his Body mass index is 50.33 kg/m.  -  clearly complicating his diabetes care.   he is a candidate for weight loss. I discussed with him  the fact that loss of 5 - 10% of his  current body weight will have the most impact on his diabetes management.  Exercise, and detailed carbohydrates information provided  -  detailed on discharge instructions.  5) Chronic Care/Health Maintenance: -he is on ACEI/ARB and Statin medications and is encouraged to initiate and continue to follow up with Ophthalmology, Dentist, Podiatrist at least yearly or according to recommendations, and advised to stay away from smoking (quit smoking about 2 years ago). I have recommended yearly flu vaccine and pneumonia vaccine at least every 5 years; moderate intensity exercise for up to 150 minutes weekly; and sleep for at least 7 hours a day.  - he is advised to maintain close follow up with Tommie Sams, DO for primary care needs, as well as his other providers for optimal and coordinated care.   - Time spent in this patient care: 60 min, of which > 50% was spent in counseling him about his diabetes and the rest reviewing his blood glucose logs, discussing his hypoglycemia and hyperglycemia episodes, reviewing his current and previous labs/studies (including abstraction from other facilities) and medications doses and developing a long term treatment plan based on the latest standards of care/guidelines; and documenting his care.    Please refer to Patient Instructions for Blood Glucose Monitoring and Insulin/Medications Dosing Guide" in media tab for additional information. Please also refer to "Patient Self Inventory" in the Media tab for reviewed elements of pertinent patient history.  Gentry Fitz participated in the discussions, expressed understanding, and voiced agreement with the above plans.  All questions were answered to his satisfaction. he is encouraged to contact clinic should he have any questions or concerns prior to his return visit.     Follow up plan: - Return in about 3 months (around 07/06/2023) for Diabetes F/U with A1c in office, No  previsit labs.    Ronny Bacon, [facility] [address] [place], [place] [address] Phone: [phone] Fax: [phone]  04/05/2023, 10:16 AM

## 2023-04-08 ENCOUNTER — Other Ambulatory Visit: Payer: Self-pay | Admitting: Family Medicine

## 2023-04-08 MED ORDER — ROSUVASTATIN CALCIUM 40 MG PO TABS: 40.0000 mg | ORAL_TABLET | Freq: Every day | ORAL | 3 refills | Status: DC

## 2023-04-09 ENCOUNTER — Other Ambulatory Visit: Payer: Self-pay | Admitting: Family Medicine

## 2023-04-12 MED ORDER — TESTOSTERONE CYPIONATE 200 MG/ML IM SOLN: 100.0000 mg | INTRAMUSCULAR | 0 refills | Status: DC

## 2023-05-09 ENCOUNTER — Ambulatory Visit: Payer: BC Managed Care – PPO | Admitting: Urology

## 2023-05-24 ENCOUNTER — Other Ambulatory Visit: Payer: Self-pay

## 2023-05-24 ENCOUNTER — Encounter: Payer: Self-pay | Admitting: Family Medicine

## 2023-05-24 MED ORDER — MELOXICAM 15 MG PO TABS: 15.0000 mg | ORAL_TABLET | Freq: Every day | ORAL | 1 refills | Status: DC | PRN

## 2023-06-13 ENCOUNTER — Encounter: Payer: Self-pay | Admitting: Family Medicine

## 2023-06-14 ENCOUNTER — Other Ambulatory Visit: Payer: Self-pay | Admitting: Family Medicine

## 2023-06-14 MED ORDER — TESTOSTERONE CYPIONATE 200 MG/ML IM SOLN: 100.0000 mg | INTRAMUSCULAR | 1 refills | Status: DC

## 2023-06-14 MED ORDER — ALPRAZOLAM 0.25 MG PO TABS: 0.2500 mg | ORAL_TABLET | Freq: Two times a day (BID) | ORAL | 3 refills | Status: DC | PRN

## 2023-06-23 ENCOUNTER — Emergency Department (HOSPITAL_COMMUNITY)
Admission: EM | Admit: 2023-06-23 | Discharge: 2023-06-23 | Disposition: A | Discharge status: Home / Self Care | Payer: BC Managed Care – PPO | Attending: Emergency Medicine | Admitting: Emergency Medicine

## 2023-06-23 ENCOUNTER — Encounter (HOSPITAL_COMMUNITY): Payer: Self-pay

## 2023-06-23 ENCOUNTER — Other Ambulatory Visit: Payer: Self-pay

## 2023-06-23 ENCOUNTER — Emergency Department (HOSPITAL_COMMUNITY): Payer: BC Managed Care – PPO

## 2023-06-23 DIAGNOSIS — U071 COVID-19: Secondary | ICD-10-CM | POA: Diagnosis not present

## 2023-06-23 DIAGNOSIS — R000 Tachycardia, unspecified: Secondary | ICD-10-CM | POA: Diagnosis not present

## 2023-06-23 DIAGNOSIS — Z7982 Long term (current) use of aspirin: Secondary | ICD-10-CM | POA: Diagnosis not present

## 2023-06-23 DIAGNOSIS — J029 Acute pharyngitis, unspecified: Secondary | ICD-10-CM | POA: Diagnosis present

## 2023-06-23 LAB — RESP PANEL BY RT-PCR (RSV, FLU A&B, COVID)  RVPGX2
Influenza A by PCR: NEGATIVE
Influenza B by PCR: NEGATIVE
Resp Syncytial Virus by PCR: NEGATIVE
SARS Coronavirus 2 by RT PCR: POSITIVE — AB

## 2023-06-23 LAB — GROUP A STREP BY PCR: Group A Strep by PCR: NOT DETECTED

## 2023-06-23 MED ORDER — KETOROLAC TROMETHAMINE 60 MG/2ML IM SOLN
30.0000 mg | Freq: Once | INTRAMUSCULAR | Status: AC
  Administered 2023-06-23: 30 mg via INTRAMUSCULAR
  Filled 2023-06-23: qty 2

## 2023-06-23 NOTE — ED Notes (Signed)
Patient transported to X-ray 

## 2023-06-23 NOTE — ED Provider Notes (Signed)
**Joshua Joshua** Joshua Joshua   CSN: [id number] Arrival date & time: 06/23/23  0425     History  Chief Complaint  Patient presents with   Fever   Generalized Body Aches   Abscess    Behind Left ear.    Joshua Joshua is a 47 y.o. male.  47 year old male presents ER today with multiple symptoms.  Started within the last 24 hours a consistent body aches, chills, rigors, teeth chattering, sore throat.  No cough.  Feels like he had a fever but they do not have a thermometer at home.  Limited shortness of breath.  Took a COVID test at home and it was negative.  Wife was given Tylenol and ibuprofen and it seemed to help some but symptoms persisted so he presents here for further evaluation.  Patient states he had a abscess behind his left ear is been on antibiotics since improving hide he wonders if he may have gotten bacteria in his blood causing these issues.  Thus he presents the ER for further evaluation.  No new lower extremity swelling.  No chest pain outside of taking deep breaths.  No rashes.  Some nausea no vomiting or diarrhea.  No abdominal pain.   Fever Abscess Associated symptoms: fever        Home Medications Prior to Admission medications   Medication Sig Start Date End Date Taking? Authorizing Provider  ALPRAZolam (XANAX) 0.25 MG tablet Take 1 tablet (0.25 mg total) by mouth 2 (two) times daily as needed for anxiety. 06/14/23   Joshua Sams, DO  amLODipine (NORVASC) 10 MG tablet Take 1 tablet (10 mg total) by mouth daily. 12/26/22   Joshua Sams, DO  aspirin EC 81 MG tablet Take 81 mg by mouth daily. Swallow whole.    [provider]  Cholecalciferol (VITAMIN D3) 125 MCG (5000 UT) TABS Take 2 tablets by mouth daily.    [provider]  losartan-hydrochlorothiazide (HYZAAR) 100-25 MG tablet Take 1 tablet by mouth daily. 11/30/22   Joshua Sams, DO  meloxicam (MOBIC) 15 MG tablet Take 1 tablet (15 mg total)  by mouth daily as needed for pain. 05/24/23   Joshua Sams, DO  metFORMIN (GLUCOPHAGE) 1000 MG tablet Take 1 tablet (1,000 mg total) by mouth 2 (two) times daily with a meal. 11/30/22   Chang, Joshua Rank G, DO  rosuvastatin (CRESTOR) 40 MG tablet Take 1 tablet (40 mg total) by mouth daily. 04/08/23   Joshua Sams, DO  sildenafil (VIAGRA) 50 MG tablet Take 1-2 tablets (50-100 mg total) by mouth daily as needed for erectile dysfunction. 12/30/21   Joshua Sams, DO  testosterone cypionate (DEPO-TESTOSTERONE) 200 MG/ML injection Inject 0.5 mLs (100 mg total) into the muscle every 7 (seven) days. 06/14/23   Joshua Sams, DO  traZODone (DESYREL) 50 MG tablet Take 1 tablet (50 mg total) by mouth at bedtime as needed for sleep. 03/27/23   Joshua Sams, DO      Allergies    Patient has no known allergies.    Review of Systems   Review of Systems  Constitutional:  Positive for fever.    Physical Exam Updated Vital Signs BP (!) 145/92   Pulse (!) 106   Temp 98.4 F (36.9 C) (Oral)   Resp (!) 23   Ht 5\' 9"  (1.753 m)   Wt (!) 146.1 kg   SpO2 98%   BMI 47.55 kg/m  Physical Exam  Vitals and nursing Joshua reviewed.  Constitutional:      Appearance: He is well-developed.  HENT:     Head: Normocephalic and atraumatic.     Mouth/Throat:     Mouth: Mucous membranes are moist.  Eyes:     Pupils: Pupils are equal, round, and reactive to light.  Cardiovascular:     Rate and Rhythm: Tachycardia present.  Pulmonary:     Effort: Pulmonary effort is normal. No respiratory distress.  Abdominal:     General: There is no distension.  Musculoskeletal:        General: Normal range of motion.     Cervical back: Normal range of motion.  Neurological:     Mental Status: He is alert.     ED Results / Procedures / Treatments   Labs (all labs ordered are listed, but only abnormal results are displayed) Labs Reviewed  RESP PANEL BY RT-PCR (RSV, FLU A&B, COVID)  [id number] - Abnormal; Notable for the following  components:      Result Value   SARS Coronavirus 2 by RT PCR POSITIVE (*)    All other components within normal limits  GROUP A STREP BY PCR    EKG EKG Interpretation Date/Time:  Saturday June 23 2023 04:58:46 EST Ventricular Rate:  105 PR Interval:  139 QRS Duration:  92 QT Interval:  321 QTC Calculation: 425 R Axis:   37  Text Interpretation: Sinus tachycardia Borderline repolarization abnormality No significant change since last tracing Confirmed by Joshua Joshua [phone]) on 06/23/2023 5:46:23 AM  Radiology DG Chest 2 View Result Date: 06/23/2023 CLINICAL DATA:  Body aches.  Chills. EXAM: CHEST - 2 VIEW COMPARISON:  08/23/2012 FINDINGS: The heart size and mediastinal contours are within normal limits. Both lungs are clear. The visualized skeletal structures are unremarkable. IMPRESSION: No active cardiopulmonary disease. Electronically Signed   By: Joshua Joshua M.D.   On: 06/23/2023 06:13    Procedures Procedures    Medications Ordered in ED Medications  ketorolac (TORADOL) injection 30 mg (30 mg Intramuscular Given 06/23/23 0739)    ED Course/ Medical Decision Making/ A&P                                 Medical Decision Making Patient says x-ray viewed interpreted by myself without any obvious consolidative pneumonia, pulmonary edema or effusions.  Radiology read reviewed.  COVID and strep test and COVID test negative, back positive.  This likely explains his symptoms.  Not really candidate for anti-inflammatory.  Voiding told provide immunocompromise such as his wife.  Also discussed masking up for Joshua Joshua 5 days modalities for fever.  Hold on antibiotics at home and continued to be good hydration return for any evidence of hydration, or breathing or other concerns.  Amount and/or Complexity of Data Reviewed Independent Historian: spouse Radiology: ordered. ECG/medicine tests: ordered.  Risk Prescription drug management.   Final Clinical Impression(s) / ED  Diagnoses Final diagnoses:  COVID    Rx / DC Orders ED Discharge Orders     None         Emile Kyllo, Barbara Cower, MD 06/23/23 2103

## 2023-06-23 NOTE — ED Triage Notes (Addendum)
Patient complaint of body aches, legs hurting, having chills and throat feels scratchy. Patient stated he took a Covid test at  home and come back negative, Believes the test was old. Has been having a fever that started around 8:30 PM. Has an boil behind left ear and has been taking antibiotics. Last took advil 03:30 am this morning.

## 2023-06-23 NOTE — ED Notes (Signed)
Patient complaining of bilateral legs, described as "feeling like someone standing on them" while the patient is sitting. MD notified.

## 2023-07-06 ENCOUNTER — Encounter: Payer: Self-pay | Admitting: Nurse Practitioner

## 2023-07-06 ENCOUNTER — Ambulatory Visit (INDEPENDENT_AMBULATORY_CARE_PROVIDER_SITE_OTHER): Payer: BC Managed Care – PPO | Admitting: Nurse Practitioner

## 2023-07-06 VITALS — BP 145/87 | HR 87 | Ht 68.0 in | Wt 324.0 lb

## 2023-07-06 DIAGNOSIS — E782 Mixed hyperlipidemia: Secondary | ICD-10-CM

## 2023-07-06 DIAGNOSIS — Z7984 Long term (current) use of oral hypoglycemic drugs: Secondary | ICD-10-CM | POA: Diagnosis not present

## 2023-07-06 DIAGNOSIS — E1165 Type 2 diabetes mellitus with hyperglycemia: Secondary | ICD-10-CM | POA: Diagnosis not present

## 2023-07-06 LAB — POCT GLYCOSYLATED HEMOGLOBIN (HGB A1C): Hemoglobin A1C: 7.6 % — AB (ref 4.0–5.6)

## 2023-07-06 NOTE — Progress Notes (Signed)
 Endocrinology Follow Up Note       07/06/2023, 8:54 AM   Subjective:    Patient ID: Joshua Chang, male    DOB: Aug 21, 1975.  Joshua Chang is being seen in follow up after being seen in consultation for management of currently uncontrolled symptomatic diabetes requested by  Cook, Jayce G, DO.   Past Medical History:  Diagnosis Date   Anxiety    Depression    Diabetes mellitus without complication (HCC)    Hyperlipidemia    Hypertension    Hypogonadism in male     Past Surgical History:  Procedure Laterality Date   TYMPANOSTOMY TUBE PLACEMENT      Social History   Socioeconomic History   Marital status: Married    Spouse name: Not on file   Number of children: Not on file   Years of education: Not on file   Highest education level: GED or equivalent  Occupational History   Not on file  Tobacco Use   Smoking status: Former    Current packs/day: 0.00    Types: Cigarettes    Quit date: 2022    Years since quitting: 3.0   Smokeless tobacco: Never  Vaping Use   Vaping status: Former  Substance and Sexual Activity   Alcohol use: No   Drug use: No   Sexual activity: Not on file  Other Topics Concern   Not on file  Social History Narrative   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   Social Drivers of Corporate Investment Banker Strain: Low Risk  (12/25/2022)   Overall Financial Resource Strain (CARDIA)    Difficulty of Paying Living Expenses: Not hard at all  Food Insecurity: No Food Insecurity (12/25/2022)   Hunger Vital Sign    Worried About Running Out of Food in the Last Year: Never true    Ran Out of Food in the Last Year: Never true  Transportation Needs: No Transportation Needs (12/25/2022)   PRAPARE - Administrator, Civil Service (Medical): No    Lack of Transportation (Non-Medical): No  Physical Activity: Sufficiently Active  (12/25/2022)   Exercise Vital Sign    Days of Exercise per Week: 3 days    Minutes of Exercise per Session: 60 min  Stress: Stress Concern Present (12/25/2022)   [employer] of Occupational Health - Occupational Stress Questionnaire    Feeling of Stress : Very much  Social Connections: Unknown (12/25/2022)   Social Connection and Isolation Panel [NHANES]    Frequency of Communication with Friends and Family: Patient declined    Frequency of Social Gatherings with Friends and Family: Never    Attends Religious Services: More than 4 times per year    Active Member of [employer] or Organizations: Yes    Attends Engineer, Structural: More than 4 times per year    Marital Status: Married    Family History  Problem Relation Age of Onset   Depression Mother    Hyperlipidemia Mother    Hypertension Father    COPD Father    Cancer Father    Thyroid disease Sister     Outpatient Encounter Medications  as of 07/06/2023  Medication Sig   ALPRAZolam  (XANAX ) 0.25 MG tablet Take 1 tablet (0.25 mg total) by mouth 2 (two) times daily as needed for anxiety.   amLODipine  (NORVASC ) 10 MG tablet Take 1 tablet (10 mg total) by mouth daily.   aspirin EC 81 MG tablet Take 81 mg by mouth daily. Swallow whole.   Cholecalciferol (VITAMIN D3) 125 MCG (5000 UT) TABS Take 2 tablets by mouth daily.   losartan -hydrochlorothiazide (HYZAAR) 100-25 MG tablet Take 1 tablet by mouth daily.   meloxicam  (MOBIC ) 15 MG tablet Take 1 tablet (15 mg total) by mouth daily as needed for pain.   metFORMIN  (GLUCOPHAGE ) 1000 MG tablet Take 1 tablet (1,000 mg total) by mouth 2 (two) times daily with a meal.   rosuvastatin  (CRESTOR ) 40 MG tablet Take 1 tablet (40 mg total) by mouth daily.   sildenafil  (VIAGRA ) 50 MG tablet Take 1-2 tablets (50-100 mg total) by mouth daily as needed for erectile dysfunction.   testosterone  cypionate (DEPO-TESTOSTERONE ) 200 MG/ML injection Inject 0.5 mLs (100 mg total) into the muscle every 7  (seven) days.   traZODone  (DESYREL ) 50 MG tablet Take 1 tablet (50 mg total) by mouth at bedtime as needed for sleep.   No facility-administered encounter medications on file as of 07/06/2023.    ALLERGIES: No Known Allergies  VACCINATION STATUS: Immunization History  Administered Date(s) Administered   Influenza, Seasonal, Injecte, Preservative Fre 03/27/2023   Influenza,inj,Quad PF,6+ Mos 03/15/2019, 03/21/2020   Moderna Sars-Covid-2 Vaccination 09/13/2019, 10/15/2019, 08/06/2020    Diabetes He presents for his follow-up diabetic visit. He has type 2 diabetes mellitus. Onset time: Diagnosed at approx age of 48. His disease course has been fluctuating. There are no hypoglycemic associated symptoms. There are no diabetic associated symptoms. There are no hypoglycemic complications. Symptoms are stable. Diabetic complications include impotence. Risk factors for coronary artery disease include diabetes mellitus, dyslipidemia, family history, male sex, obesity and sedentary lifestyle. Current diabetic treatment includes oral agent (monotherapy). He is compliant with treatment most of the time. His weight is fluctuating minimally. He is following a generally unhealthy diet. When asked about meal planning, he reported none. He has not had a previous visit with a dietitian. He participates in exercise intermittently. (He presents today with no meter or logs to review (accidentally left them at home).  His POCT A1c today is 7.6%, increasing from last visit of 6.9%.  He notes he has over-indulged some during the holiday season but he also had COVID and was using OTC cold and flu medications which may have also contributed to increase in glucose levels as well.  He asked me today about my thoughts on weight loss surgery.) An ACE inhibitor/angiotensin II receptor blocker is being taken. He does not see a podiatrist.Eye exam is current.     Review of systems  Constitutional: + decreasing body weight,  current Body mass index is 49.26 kg/m., no fatigue, no subjective hyperthermia, no subjective hypothermia Eyes: no blurry vision, no xerophthalmia ENT: no sore throat, no nodules palpated in throat, no dysphagia/odynophagia, no hoarseness Cardiovascular: no chest pain, no shortness of breath, no palpitations, no leg swelling Respiratory: no cough, no shortness of breath Gastrointestinal: no nausea/vomiting/diarrhea Musculoskeletal: no muscle/joint aches Skin: no rashes, no hyperemia Neurological: no tremors, no numbness, no tingling, no dizziness Psychiatric: no depression, no anxiety  Objective:     BP (!) 145/87 (BP Location: Left Arm, Patient Position: Sitting)   Pulse 87   Ht 5' 8 (1.727 m)  Wt (!) 324 lb (147 kg)   BMI 49.26 kg/m   Wt Readings from Last 3 Encounters:  07/06/23 (!) 324 lb (147 kg)  06/23/23 (!) 322 lb (146.1 kg)  04/05/23 (!) 331 lb (150.1 kg)     BP Readings from Last 3 Encounters:  07/06/23 (!) 145/87  06/23/23 (!) 145/92  04/05/23 117/74     Physical Exam- Limited  Constitutional:  Body mass index is 49.26 kg/m. , not in acute distress, normal state of mind Eyes:  EOMI, no exophthalmos Musculoskeletal: no gross deformities, strength intact in all four extremities, no gross restriction of joint movements Skin:  no rashes, no hyperemia Neurological: no tremor with outstretched hands   Diabetic Foot Exam - Simple   No data filed     CMP ( most recent) CMP     Component Value Date/Time   NA 142 12/26/2022 0911   K 4.6 12/26/2022 0911   CL 101 12/26/2022 0911   CO2 24 12/26/2022 0911   GLUCOSE 171 (H) 12/26/2022 0911   GLUCOSE 136 (H) 02/07/2021 2240   BUN 13 12/26/2022 0911   CREATININE 1.12 12/26/2022 0911   CREATININE 1.13 09/21/2020 0827   CALCIUM  10.2 12/26/2022 0911   PROT 7.3 12/26/2022 0911   ALBUMIN 4.5 12/26/2022 0911   AST 24 12/26/2022 0911   ALT 28 12/26/2022 0911   ALKPHOS 94 12/26/2022 0911   BILITOT 0.4 12/26/2022  0911   EGFR 83 12/26/2022 0911   GFRNONAA >60 02/07/2021 2240   GFRNONAA 79 09/21/2020 0827     Diabetic Labs (most recent): Lab Results  Component Value Date   HGBA1C 7.6 (A) 07/06/2023   HGBA1C 6.9 (H) 04/03/2023   HGBA1C 7.9 (H) 12/26/2022     Lipid Panel ( most recent) Lipid Panel     Component Value Date/Time   CHOL 205 (H) 04/03/2023 1115   TRIG 47 04/03/2023 1115   HDL 61 04/03/2023 1115   CHOLHDL 3.4 04/03/2023 1115   CHOLHDL 3.9 09/21/2020 0827   LDLCALC 135 (H) 04/03/2023 1115   LDLCALC 122 (H) 09/21/2020 0827   LABVLDL 9 04/03/2023 1115      Lab Results  Component Value Date   TSH 1.27 10/15/2019           Assessment & Plan:   1) Type 2 diabetes mellitus with hyperglycemia, without long-term current use of insulin (HCC)  He presents today with no meter or logs to review (accidentally left them at home).  His POCT A1c today is 7.6%, increasing from last visit of 6.9%.  He notes he has over-indulged some during the holiday season but he also had COVID and was using OTC cold and flu medications which may have also contributed to increase in glucose levels as well.  He asked me today about my thoughts on weight loss surgery.  - Jay Haskew has currently uncontrolled symptomatic type 2 DM since 48 years of age.   -Recent labs reviewed.  - I had a long discussion with him about the progressive nature of diabetes and the pathology behind its complications. -his diabetes is complicated by ED and he remains at a high risk for more acute and chronic complications which include CAD, CVA, CKD, retinopathy, and neuropathy. These are all discussed in detail with him.  The following Lifestyle Medicine recommendations according to American College of Lifestyle Medicine [facility]) were discussed and offered to patient and he agrees to start the journey:  A. Whole Foods, Plant-based plate comprising of  fruits and vegetables, plant-based proteins, whole-grain  carbohydrates was discussed in detail with the patient.   A list for source of those nutrients were also provided to the patient.  Patient will use only water or unsweetened tea for hydration. B.  The need to stay away from risky substances including alcohol, smoking; obtaining 7 to 9 hours of restorative sleep, at least 150 minutes of moderate intensity exercise weekly, the importance of healthy social connections,  and stress reduction techniques were discussed. C.  A full color page of  Calorie density of various food groups per pound showing examples of each food groups was provided to the patient.  - Nutritional counseling repeated at each appointment due to patients tendency to fall back in to old habits.  - The patient admits there is a room for improvement in their diet and drink choices. -  Suggestion is made for the patient to avoid simple carbohydrates from their diet including Cakes, Sweet Desserts / Pastries, Ice Cream, Soda (diet and regular), Sweet Tea, Candies, Chips, Cookies, Sweet Pastries, Store Bought Juices, Alcohol in Excess of 1-2 drinks a day, Artificial Sweeteners, Coffee Creamer, and Sugar-free Products. This will help patient to have stable blood glucose profile and potentially avoid unintended weight gain.   - I encouraged the patient to switch to unprocessed or minimally processed complex starch and increased protein intake (animal or plant source), fruits, and vegetables.   - Patient is advised to stick to a routine mealtimes to eat 3 meals a day and avoid unnecessary snacks (to snack only to correct hypoglycemia).  - I have approached him with the following individualized plan to manage his diabetes and patient agrees:    -he is encouraged to continue monitoring fasting glucose 2-3 times per week and to call the clinic if his readings are less than 70 or above 200 for 3 tests in a row.  I gave him flyer for [facility] which may help educate him on which foods are causing  high spikes in glucose and help him improve without escalation of medication treatment.  - Adjustment parameters are given to him for hypo and hyperglycemia in writing.  - he is advised to continue Metformin  1000 mg po twice daily after meals, therapeutically suitable for patient .  He seems really motivated to do better without medications.    - he will be considered for incretin therapy as appropriate next visit.  He was prescribed Ozempic  in the past but the copay was too expensive.  I encouraged him to reach out to his insurance plan and see if coverage has changed for these medications.  - Specific targets for  A1c; LDL, HDL, and Triglycerides were discussed with the patient.  2) Blood Pressure /Hypertension:  his blood pressure is controlled to target.   he is advised to continue his current medications including Amlodipine  10 mg p.o. daily with breakfast, and Losartan /HCT 100-25 mg po daily.  3) Lipids/Hyperlipidemia:    Review of his recent lipid panel from 04/03/23 showed uncontrolled LDL at 135 .  he is advised to continue Crestor  10 mg daily at bedtime.  Side effects and precautions discussed with him.  4)  Weight/Diet:  his Body mass index is 49.26 kg/m.  -  clearly complicating his diabetes care.   he is a candidate for weight loss. I discussed with him the fact that loss of 5 - 10% of his  current body weight will have the most impact on his diabetes management.  Exercise, and  detailed carbohydrates information provided  -  detailed on discharge instructions.  He asked me about weight loss surgery.  I advised him to do his research and weigh out the risks vs benefits.  This is irreversible and may cause complications down the road.  We talked about potentially trying GLP1 product first, if covered by his insurance.  5) Chronic Care/Health Maintenance: -he is on ACEI/ARB and Statin medications and is encouraged to initiate and continue to follow up with Ophthalmology, Dentist,  Podiatrist at least yearly or according to recommendations, and advised to stay away from smoking (quit smoking about 2 years ago). I have recommended yearly flu vaccine and pneumonia vaccine at least every 5 years; moderate intensity exercise for up to 150 minutes weekly; and sleep for at least 7 hours a day.  - he is advised to maintain close follow up with Cook, Jayce G, DO for primary care needs, as well as his other providers for optimal and coordinated care.     I spent  46  minutes in the care of the patient today including review of labs from CMP, Lipids, Thyroid Function, Hematology (current and previous including abstractions from other facilities); face-to-face time discussing  his blood glucose readings/logs, discussing hypoglycemia and hyperglycemia episodes and symptoms, medications doses, his options of short and long term treatment based on the latest standards of care / guidelines;  discussion about incorporating lifestyle medicine;  and documenting the encounter. Risk reduction counseling performed per USPSTF guidelines to reduce obesity and cardiovascular risk factors.     Please refer to Patient Instructions for Blood Glucose Monitoring and Insulin/Medications Dosing Guide  in media tab for additional information. Please  also refer to  Patient Self Inventory in the Media  tab for reviewed elements of pertinent patient history.  Alexa Darin Gull participated in the discussions, expressed understanding, and voiced agreement with the above plans.  All questions were answered to his satisfaction. he is encouraged to contact clinic should he have any questions or concerns prior to his return visit.     Follow up plan: - Return in about 3 months (around 10/04/2023) for Diabetes F/U with A1c in office, No previsit labs, Bring meter and logs.    Benton Rio, [facility] [address] [place], [place] [address] Phone: [phone] Fax:  [phone]  07/06/2023, 8:54 AM

## 2023-07-10 ENCOUNTER — Telehealth: Payer: Self-pay | Admitting: Urology

## 2023-07-10 NOTE — Telephone Encounter (Signed)
-----   Message from Joshua Chang sent at 07/09/2023  3:34 PM EST ----- Regarding: follow up Patient is overdue for FU and psa check

## 2023-07-10 NOTE — Telephone Encounter (Signed)
 LVM to call office back to sch.

## 2023-07-17 ENCOUNTER — Telehealth: Payer: Self-pay | Admitting: Urology

## 2023-07-17 NOTE — Telephone Encounter (Signed)
 Pt called back I let him know we wanted to schedule him a follow up and PSA check. Pt stated he was not coming back to our office.

## 2023-07-31 ENCOUNTER — Other Ambulatory Visit: Payer: Self-pay | Admitting: Family Medicine

## 2023-07-31 MED ORDER — MELOXICAM 15 MG PO TABS: 15.0000 mg | ORAL_TABLET | Freq: Every day | ORAL | 3 refills | Status: DC | PRN

## 2023-09-26 ENCOUNTER — Ambulatory Visit (INDEPENDENT_AMBULATORY_CARE_PROVIDER_SITE_OTHER): Payer: BC Managed Care – PPO | Admitting: Family Medicine

## 2023-09-26 VITALS — BP 145/89 | HR 82 | Temp 98.1°F | Ht 68.0 in | Wt 338.8 lb

## 2023-09-26 DIAGNOSIS — F419 Anxiety disorder, unspecified: Secondary | ICD-10-CM

## 2023-09-26 DIAGNOSIS — E291 Testicular hypofunction: Secondary | ICD-10-CM | POA: Diagnosis not present

## 2023-09-26 DIAGNOSIS — F32A Depression, unspecified: Secondary | ICD-10-CM

## 2023-09-26 DIAGNOSIS — I10 Essential (primary) hypertension: Secondary | ICD-10-CM

## 2023-09-26 DIAGNOSIS — E119 Type 2 diabetes mellitus without complications: Secondary | ICD-10-CM | POA: Diagnosis not present

## 2023-09-26 DIAGNOSIS — E785 Hyperlipidemia, unspecified: Secondary | ICD-10-CM

## 2023-09-26 DIAGNOSIS — D72829 Elevated white blood cell count, unspecified: Secondary | ICD-10-CM

## 2023-09-26 MED ORDER — ESCITALOPRAM OXALATE 10 MG PO TABS
10.0000 mg | ORAL_TABLET | Freq: Every day | ORAL | 1 refills | Status: DC
Start: 2023-09-26 — End: 2024-02-12

## 2023-09-26 MED ORDER — ALPRAZOLAM 0.5 MG PO TABS: 0.5000 mg | ORAL_TABLET | Freq: Two times a day (BID) | ORAL | 3 refills | Status: DC | PRN

## 2023-09-26 NOTE — Assessment & Plan Note (Signed)
 BP mildly elevated here today.  Continue current medication.  Hopefully with weight loss, this will improve and patient will come off medication.

## 2023-09-26 NOTE — Patient Instructions (Addendum)
 Labs today.  Medication sent in.  Referral placed.  Follow up in 3 months.

## 2023-09-26 NOTE — Assessment & Plan Note (Addendum)
 Referring to bariatric surgery.  Patient is interested in sleeve gastrectomy.

## 2023-09-26 NOTE — Assessment & Plan Note (Signed)
 Uncontrolled, worsening.  Starting Lexapro.  Increasing dosage of alprazolam.

## 2023-09-26 NOTE — Progress Notes (Signed)
 Subjective:  Patient ID: Joshua Chang, male    DOB: [birth year]  Age: 48 y.o. MRN: [id number]  CC:  Follow up  HPI:  48 year old male with morbid obesity, hypertension, hypogonadism, type 2 diabetes, depression and anxiety, hyperlipidemia presents for follow-up.  Patient reports significant anxiety.  He states that he has a lot of stress.  PHQ-9 score of 15 and GAD-7 score of 6 today.  Patient is currently taking alprazolam as needed for anxiety.  He states that he has been having to take this more frequently due to worsening anxiety.  Will discuss treatment options today regarding anxiety and depression.  Patient's blood pressure mildly elevated here today.  He is compliant with amlodipine and losartan/HCTZ.  Patient states that he is interested in bariatric surgery.  He would like to discuss this today.  Patient follows with endocrinology regarding type 2 diabetes.  Last A1c 7.6.  Patient Active Problem List   Diagnosis Date Noted   Anxiety and depression 09/26/2023   Arthralgia of right hand 03/27/2023   Insomnia 03/27/2023   Erectile dysfunction 12/30/2021   Morbid obesity (HCC) 09/29/2021   Hyperlipidemia 09/29/2021   Hypogonadism in male 09/29/2021   Type 2 diabetes mellitus without complication, without long-term current use of insulin (HCC) 09/29/2021   Essential hypertension, benign 05/29/2012    Social Hx   Social History   Socioeconomic History   Marital status: Married    Spouse name: Not on file   Number of children: Not on file   Years of education: Not on file   Highest education level: GED or equivalent  Occupational History   Not on file  Tobacco Use   Smoking status: Former    Current packs/day: 0.00    Types: Cigarettes    Quit date: 2022    Years since quitting: 3.2   Smokeless tobacco: Never  Vaping Use   Vaping status: Former  Substance and Sexual Activity   Alcohol use: No   Drug use: No   Sexual activity: Not on file  Other Topics  Concern   Not on file  Social History Narrative   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   Social Drivers of Corporate investment banker Strain: Low Risk  (09/22/2023)   Overall Financial Resource Strain (CARDIA)    Difficulty of Paying Living Expenses: Not hard at all  Food Insecurity: No Food Insecurity (09/22/2023)   Hunger Vital Sign    Worried About Running Out of Food in the Last Year: Never true    Ran Out of Food in the Last Year: Never true  Transportation Needs: No Transportation Needs (09/22/2023)   PRAPARE - Administrator, Civil Service (Medical): No    Lack of Transportation (Non-Medical): No  Physical Activity: Inactive (09/22/2023)   Exercise Vital Sign    Days of Exercise per Week: 0 days    Minutes of Exercise per Session: 60 min  Stress: Stress Concern Present (09/22/2023)   [employer] of Occupational Health - Occupational Stress Questionnaire    Feeling of Stress : Very much  Social Connections: Moderately Integrated (09/22/2023)   Social Connection and Isolation Panel [NHANES]    Frequency of Communication with Friends and Family: Once a week    Frequency of Social Gatherings with Friends and Family: Once a week    Attends Religious Services: More than 4 times per year    Active Member of [employer] or Organizations: Yes  Attends Banker Meetings: More than 4 times per year    Marital Status: Married    Review of Systems Per HPI  Objective:  BP (!) 145/89   Pulse 82   Temp 98.1 F (36.7 C)   Ht 5\' 8"  (1.727 m)   Wt (!) 338 lb 12.8 oz (153.7 kg)   SpO2 100%   BMI 51.51 kg/m      09/26/2023    8:27 AM 09/26/2023    8:17 AM 07/06/2023    8:24 AM  BP/Weight  Systolic BP 145 147 145  Diastolic BP 89 89 87  Wt. (Lbs)  338.8 324  BMI  51.51 kg/m2 49.26 kg/m2    Physical Exam Vitals and nursing note reviewed.  Constitutional:      General: He is not in acute distress.    Appearance:  Normal appearance.  HENT:     Head: Normocephalic and atraumatic.  Cardiovascular:     Rate and Rhythm: Normal rate and regular rhythm.  Pulmonary:     Effort: Pulmonary effort is normal.     Breath sounds: Normal breath sounds. No wheezing, rhonchi or rales.  Neurological:     Mental Status: He is alert.  Psychiatric:        Mood and Affect: Mood normal.        Behavior: Behavior normal.     Lab Results  Component Value Date   WBC 11.2 (H) 12/26/2022   HGB 14.4 12/26/2022   HCT 45.6 12/26/2022   PLT 355 12/26/2022   GLUCOSE 171 (H) 12/26/2022   CHOL 205 (H) 04/03/2023   TRIG 47 04/03/2023   HDL 61 04/03/2023   LDLCALC 135 (H) 04/03/2023   ALT 28 12/26/2022   AST 24 12/26/2022   NA 142 12/26/2022   K 4.6 12/26/2022   CL 101 12/26/2022   CREATININE 1.12 12/26/2022   BUN 13 12/26/2022   CO2 24 12/26/2022   TSH 1.27 10/15/2019   HGBA1C 7.6 (A) 07/06/2023     Assessment & Plan:  Anxiety and depression Assessment & Plan: Uncontrolled, worsening.  Starting Lexapro.  Increasing dosage of alprazolam.  Orders: -     Escitalopram Oxalate; Take 1 tablet (10 mg total) by mouth daily.  Dispense: 90 tablet; Refill: 1 -     ALPRAZolam; Take 1 tablet (0.5 mg total) by mouth 2 (two) times daily as needed for anxiety.  Dispense: 60 tablet; Refill: 3  Hyperlipidemia, unspecified hyperlipidemia type -     Lipid panel  Type 2 diabetes mellitus without complication, without long-term current use of insulin (HCC) -     CMP14+EGFR -     Microalbumin / creatinine urine ratio  Hypogonadism in male -     Testosterone  Leukocytosis, unspecified type -     CBC  Morbid obesity (HCC) Assessment & Plan: Referring to bariatric surgery.  Patient is interested in sleeve gastrectomy.  Orders: -     Amb Referral to Bariatric Surgery  Essential hypertension, benign Assessment & Plan: BP mildly elevated here today.  Continue current medication.  Hopefully with weight loss, this will  improve and patient will come off medication.     Follow-up:  Return in about 3 months (around 12/27/2023).  Everlene Other DO [facility]

## 2023-10-09 ENCOUNTER — Encounter: Payer: Self-pay | Admitting: Nurse Practitioner

## 2023-10-09 ENCOUNTER — Ambulatory Visit (INDEPENDENT_AMBULATORY_CARE_PROVIDER_SITE_OTHER): Payer: BC Managed Care – PPO | Admitting: Nurse Practitioner

## 2023-10-09 ENCOUNTER — Telehealth: Payer: Self-pay | Admitting: *Deleted

## 2023-10-09 VITALS — BP 130/82 | HR 65 | Ht 68.0 in | Wt 346.6 lb

## 2023-10-09 DIAGNOSIS — E782 Mixed hyperlipidemia: Secondary | ICD-10-CM | POA: Diagnosis not present

## 2023-10-09 DIAGNOSIS — E1165 Type 2 diabetes mellitus with hyperglycemia: Secondary | ICD-10-CM | POA: Diagnosis not present

## 2023-10-09 DIAGNOSIS — Z7984 Long term (current) use of oral hypoglycemic drugs: Secondary | ICD-10-CM | POA: Diagnosis not present

## 2023-10-09 LAB — POCT GLYCOSYLATED HEMOGLOBIN (HGB A1C): Hemoglobin A1C: 8.4 % — AB (ref 4.0–5.6)

## 2023-10-09 MED ORDER — TIRZEPATIDE 2.5 MG/0.5ML ~~LOC~~ SOAJ: 2.5000 mg | SUBCUTANEOUS | 0 refills | Status: DC

## 2023-10-09 MED ORDER — TIRZEPATIDE 5 MG/0.5ML ~~LOC~~ SOAJ
5.0000 mg | SUBCUTANEOUS | 1 refills | Status: DC
Start: 2023-11-06 — End: 2023-10-10

## 2023-10-09 MED ORDER — METFORMIN HCL 1000 MG PO TABS: 1000.0000 mg | ORAL_TABLET | Freq: Two times a day (BID) | ORAL | 3 refills | Status: DC

## 2023-10-09 NOTE — Telephone Encounter (Signed)
 Patient left a voicemail that at his office visit today something was said about Mounjaro. HE checked with his insurance provider and was told that the Ozempic would be a copay of $50, and this is for a 30 day supply. Also , there is a a PA already completed and on file for this. He is wanting to know if he can use this one instead.

## 2023-10-09 NOTE — Progress Notes (Signed)
 Endocrinology Follow Up Note       10/09/2023, 9:37 AM   Subjective:    Patient ID: Joshua Chang, male    DOB: [birth year].  Silus Lanzo is being seen in follow up after being seen in consultation for management of currently uncontrolled symptomatic diabetes requested by  Tommie Sams, DO.   Past Medical History:  Diagnosis Date   Anxiety    Depression    Diabetes mellitus without complication (HCC)    Hyperlipidemia    Hypertension    Hypogonadism in male     Past Surgical History:  Procedure Laterality Date   TYMPANOSTOMY TUBE PLACEMENT      Social History   Socioeconomic History   Marital status: Married    Spouse name: Not on file   Number of children: Not on file   Years of education: Not on file   Highest education level: GED or equivalent  Occupational History   Not on file  Tobacco Use   Smoking status: Former    Current packs/day: 0.00    Types: Cigarettes    Quit date: 2022    Years since quitting: 3.2   Smokeless tobacco: Never  Vaping Use   Vaping status: Former  Substance and Sexual Activity   Alcohol use: No   Drug use: No   Sexual activity: Not on file  Other Topics Concern   Not on file  Social History Narrative   Married for 15 years.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   Social Drivers of Corporate investment banker Strain: Low Risk  (09/22/2023)   Overall Financial Resource Strain (CARDIA)    Difficulty of Paying Living Expenses: Not hard at all  Food Insecurity: No Food Insecurity (09/22/2023)   Hunger Vital Sign    Worried About Running Out of Food in the Last Year: Never true    Ran Out of Food in the Last Year: Never true  Transportation Needs: No Transportation Needs (09/22/2023)   PRAPARE - Administrator, Civil Service (Medical): No    Lack of Transportation (Non-Medical): No  Physical Activity: Inactive (09/22/2023)    Exercise Vital Sign    Days of Exercise per Week: 0 days    Minutes of Exercise per Session: 60 min  Stress: Stress Concern Present (09/22/2023)   [employer] of Occupational Health - Occupational Stress Questionnaire    Feeling of Stress : Very much  Social Connections: Moderately Integrated (09/22/2023)   Social Connection and Isolation Panel [NHANES]    Frequency of Communication with Friends and Family: Once a week    Frequency of Social Gatherings with Friends and Family: Once a week    Attends Religious Services: More than 4 times per year    Active Member of [employer] or Organizations: Yes    Attends Engineer, structural: More than 4 times per year    Marital Status: Married    Family History  Problem Relation Age of Onset   Depression Mother    Hyperlipidemia Mother    Hypertension Father    COPD Father    Cancer Father    Thyroid disease Sister  Outpatient Encounter Medications as of 10/09/2023  Medication Sig   ALPRAZolam (XANAX) 0.5 MG tablet Take 1 tablet (0.5 mg total) by mouth 2 (two) times daily as needed for anxiety.   amLODipine (NORVASC) 10 MG tablet Take 1 tablet (10 mg total) by mouth daily.   aspirin EC 81 MG tablet Take 81 mg by mouth daily. Swallow whole.   Cholecalciferol (VITAMIN D3) 125 MCG (5000 UT) TABS Take 2 tablets by mouth daily.   escitalopram (LEXAPRO) 10 MG tablet Take 1 tablet (10 mg total) by mouth daily.   losartan-hydrochlorothiazide (HYZAAR) 100-25 MG tablet Take 1 tablet by mouth daily.   meloxicam (MOBIC) 15 MG tablet Take 1 tablet (15 mg total) by mouth daily as needed for pain.   rosuvastatin (CRESTOR) 40 MG tablet Take 1 tablet (40 mg total) by mouth daily.   sildenafil (VIAGRA) 50 MG tablet Take 1-2 tablets (50-100 mg total) by mouth daily as needed for erectile dysfunction.   testosterone cypionate (DEPO-TESTOSTERONE) 200 MG/ML injection Inject 0.5 mLs (100 mg total) into the muscle every 7 (seven) days.   tirzepatide  [facility]) 2.5 MG/0.5ML Pen Inject 2.5 mg into the skin once a week.   [START ON 11/06/2023] tirzepatide (MOUNJARO) 5 MG/0.5ML Pen Inject 5 mg into the skin once a week.   traZODone (DESYREL) 50 MG tablet Take 1 tablet (50 mg total) by mouth at bedtime as needed for sleep.   [DISCONTINUED] metFORMIN (GLUCOPHAGE) 1000 MG tablet Take 1 tablet (1,000 mg total) by mouth 2 (two) times daily with a meal.   metFORMIN (GLUCOPHAGE) 1000 MG tablet Take 1 tablet (1,000 mg total) by mouth 2 (two) times daily with a meal.   No facility-administered encounter medications on file as of 10/09/2023.    ALLERGIES: No Known Allergies  VACCINATION STATUS: Immunization History  Administered Date(s) Administered   Influenza, Seasonal, Injecte, Preservative Fre 03/27/2023   Influenza,inj,Quad PF,6+ Mos 03/15/2019, 03/21/2020   Moderna Sars-Covid-2 Vaccination 09/13/2019, 10/15/2019, 08/06/2020    Diabetes He presents for his follow-up diabetic visit. He has type 2 diabetes mellitus. Onset time: Diagnosed at approx age of 29. His disease course has been worsening. There are no hypoglycemic associated symptoms. There are no diabetic associated symptoms. There are no hypoglycemic complications. Symptoms are stable. Diabetic complications include impotence. Risk factors for coronary artery disease include diabetes mellitus, dyslipidemia, family history, male sex, obesity and sedentary lifestyle. Current diabetic treatment includes oral agent (monotherapy). He is compliant with treatment most of the time. His weight is increasing steadily. He is following a generally unhealthy diet. When asked about meal planning, he reported none. He has not had a previous visit with a dietitian. He participates in exercise intermittently. (He presents today with no meter or logs to review (has not been checking routinely).  His POCT A1c today is 8.4%, increasing from last visit of 7.6%.  According to his PCP OV notes, he has been referred to  bariatric weight loss clinic to be evaluated for sleeve gastrectomy.  He admits his diet has not been the best lately.) An ACE inhibitor/angiotensin II receptor blocker is being taken. He does not see a podiatrist.Eye exam is current.     Review of systems  Constitutional: + increasing body weight, current Body mass index is 52.7 kg/m., no fatigue, no subjective hyperthermia, no subjective hypothermia Eyes: no blurry vision, no xerophthalmia ENT: no sore throat, no nodules palpated in throat, no dysphagia/odynophagia, no hoarseness Cardiovascular: no chest pain, no shortness of breath, no palpitations, no  leg swelling Respiratory: no cough, no shortness of breath Gastrointestinal: no nausea/vomiting/diarrhea Musculoskeletal: no muscle/joint aches Skin: no rashes, no hyperemia Neurological: no tremors, no numbness, no tingling, no dizziness Psychiatric: no depression, no anxiety  Objective:     BP 130/82 (BP Location: Left Arm, Patient Position: Sitting, Cuff Size: Large)   Pulse 65   Ht 5\' 8"  (1.727 m)   Wt (!) 346 lb 9.6 oz (157.2 kg)   BMI 52.70 kg/m   Wt Readings from Last 3 Encounters:  10/09/23 (!) 346 lb 9.6 oz (157.2 kg)  09/26/23 (!) 338 lb 12.8 oz (153.7 kg)  07/06/23 (!) 324 lb (147 kg)     BP Readings from Last 3 Encounters:  10/09/23 130/82  09/26/23 (!) 145/89  07/06/23 (!) 145/87      Physical Exam- Limited  Constitutional:  Body mass index is 52.7 kg/m. , not in acute distress, normal state of mind Eyes:  EOMI, no exophthalmos Musculoskeletal: no gross deformities, strength intact in all four extremities, no gross restriction of joint movements Skin:  no rashes, no hyperemia Neurological: no tremor with outstretched hands   Diabetic Foot Exam - Simple   Simple Foot Form Diabetic Foot exam was performed with the following findings: Yes 10/09/2023  9:05 AM  Visual Inspection No deformities, no ulcerations, no other skin breakdown bilaterally:  Yes Sensation Testing Intact to touch and monofilament testing bilaterally: Yes Pulse Check Posterior Tibialis and Dorsalis pulse intact bilaterally: Yes Comments     CMP ( most recent) CMP     Component Value Date/Time   NA 142 12/26/2022 0911   K 4.6 12/26/2022 0911   CL 101 12/26/2022 0911   CO2 24 12/26/2022 0911   GLUCOSE 171 (H) 12/26/2022 0911   GLUCOSE 136 (H) 02/07/2021 2240   BUN 13 12/26/2022 0911   CREATININE 1.12 12/26/2022 0911   CREATININE 1.13 09/21/2020 0827   CALCIUM 10.2 12/26/2022 0911   PROT 7.3 12/26/2022 0911   ALBUMIN 4.5 12/26/2022 0911   AST 24 12/26/2022 0911   ALT 28 12/26/2022 0911   ALKPHOS 94 12/26/2022 0911   BILITOT 0.4 12/26/2022 0911   EGFR 83 12/26/2022 0911   GFRNONAA >60 02/07/2021 2240   GFRNONAA 79 09/21/2020 0827     Diabetic Labs (most recent): Lab Results  Component Value Date   HGBA1C 8.4 (A) 10/09/2023   HGBA1C 7.6 (A) 07/06/2023   HGBA1C 6.9 (H) 04/03/2023     Lipid Panel ( most recent) Lipid Panel     Component Value Date/Time   CHOL 205 (H) 04/03/2023 1115   TRIG 47 04/03/2023 1115   HDL 61 04/03/2023 1115   CHOLHDL 3.4 04/03/2023 1115   CHOLHDL 3.9 09/21/2020 0827   LDLCALC 135 (H) 04/03/2023 1115   LDLCALC 122 (H) 09/21/2020 0827   LABVLDL 9 04/03/2023 1115      Lab Results  Component Value Date   TSH 1.27 10/15/2019           Assessment & Plan:   1) Type 2 diabetes mellitus with hyperglycemia, without long-term current use of insulin (HCC)  He presents today with no meter or logs to review (has not been checking routinely).  His POCT A1c today is 8.4%, increasing from last visit of 7.6%.  According to his PCP OV notes, he has been referred to bariatric weight loss clinic to be evaluated for sleeve gastrectomy.  He admits his diet has not been the best lately.  - [place] has currently  uncontrolled symptomatic type 2 DM since 48 years of age.   -Recent labs reviewed.  - I had a long  discussion with him about the progressive nature of diabetes and the pathology behind its complications. -his diabetes is complicated by ED and he remains at a high risk for more acute and chronic complications which include CAD, CVA, CKD, retinopathy, and neuropathy. These are all discussed in detail with him.  The following Lifestyle Medicine recommendations according to American College of Lifestyle Medicine [facility]) were discussed and offered to patient and he agrees to start the journey:  A. Whole Foods, Plant-based plate comprising of fruits and vegetables, plant-based proteins, whole-grain carbohydrates was discussed in detail with the patient.   A list for source of those nutrients were also provided to the patient.  Patient will use only water or unsweetened tea for hydration. B.  The need to stay away from risky substances including alcohol, smoking; obtaining 7 to 9 hours of restorative sleep, at least 150 minutes of moderate intensity exercise weekly, the importance of healthy social connections,  and stress reduction techniques were discussed. C.  A full color page of  Calorie density of various food groups per pound showing examples of each food groups was provided to the patient.  - Nutritional counseling repeated at each appointment due to patients tendency to fall back in to old habits.  - The patient admits there is a room for improvement in their diet and drink choices. -  Suggestion is made for the patient to avoid simple carbohydrates from their diet including Cakes, Sweet Desserts / Pastries, Ice Cream, Soda (diet and regular), Sweet Tea, Candies, Chips, Cookies, Sweet Pastries, Store Bought Juices, Alcohol in Excess of 1-2 drinks a day, Artificial Sweeteners, Coffee Creamer, and "Sugar-free" Products. This will help patient to have stable blood glucose profile and potentially avoid unintended weight gain.   - I encouraged the patient to switch to unprocessed or minimally processed  complex starch and increased protein intake (animal or plant source), fruits, and vegetables.   - Patient is advised to stick to a routine mealtimes to eat 3 meals a day and avoid unnecessary snacks (to snack only to correct hypoglycemia).  - I have approached him with the following individualized plan to manage his diabetes and patient agrees:   - he is advised to continue Metformin 1000 mg po twice daily after meals, therapeutically suitable for patient .  I did also send in script for Mounjaro 2.5 mg SQ weekly x 1 month, then increase to 5 mg SQ weekly thereafter if tolerated well.  He does not smoke, has no family or personal history of pancreatitis or MTC.  We did talk about potential side effects of this medication to watch for.  I also encouraged him to download manufacturer coupon for this to help control cost.  -he is encouraged to continue monitoring fasting glucose 2-3 times per week and to call the clinic if his readings are less than 70 or above 200 for 3 tests in a row.    - Adjustment parameters are given to him for hypo and hyperglycemia in writing.  - Specific targets for  A1c; LDL, HDL, and Triglycerides were discussed with the patient.  2) Blood Pressure /Hypertension:  his blood pressure is controlled to target.   he is advised to continue his current medications as prescribed by his PCP.  3) Lipids/Hyperlipidemia:    Review of his recent lipid panel from 04/03/23 showed uncontrolled LDL at 135 .  he is advised to continue Crestor 10 mg daily at bedtime.  Side effects and precautions discussed with him.  4)  Weight/Diet:  his Body mass index is 52.7 kg/m.  -  clearly complicating his diabetes care.   he is a candidate for weight loss. I discussed with him the fact that loss of 5 - 10% of his  current body weight will have the most impact on his diabetes management.  Exercise, and detailed carbohydrates information provided  -  detailed on discharge instructions.  He asked me  about weight loss surgery.  I advised him to do his research and weigh out the risks vs benefits.  This is irreversible and may cause complications down the road.  We talked about potentially trying GLP1 product first, if covered by his insurance.  5) Chronic Care/Health Maintenance: -he is on ACEI/ARB and Statin medications and is encouraged to initiate and continue to follow up with Ophthalmology, Dentist, Podiatrist at least yearly or according to recommendations, and advised to stay away from smoking (quit smoking about 2 years ago). I have recommended yearly flu vaccine and pneumonia vaccine at least every 5 years; moderate intensity exercise for up to 150 minutes weekly; and sleep for at least 7 hours a day.  - he is advised to maintain close follow up with Tommie Sams, DO for primary care needs, as well as his other providers for optimal and coordinated care.     I spent  53  minutes in the care of the patient today including review of labs from CMP, Lipids, Thyroid Function, Hematology (current and previous including abstractions from other facilities); face-to-face time discussing  his blood glucose readings/logs, discussing hypoglycemia and hyperglycemia episodes and symptoms, medications doses, his options of short and long term treatment based on the latest standards of care / guidelines;  discussion about incorporating lifestyle medicine;  and documenting the encounter. Risk reduction counseling performed per USPSTF guidelines to reduce obesity and cardiovascular risk factors.     Please refer to Patient Instructions for Blood Glucose Monitoring and Insulin/Medications Dosing Guide"  in media tab for additional information. Please  also refer to " Patient Self Inventory" in the Media  tab for reviewed elements of pertinent patient history.  Gentry Fitz participated in the discussions, expressed understanding, and voiced agreement with the above plans.  All questions were answered  to his satisfaction. he is encouraged to contact clinic should he have any questions or concerns prior to his return visit.     Follow up plan: - Return in about 4 months (around 02/08/2024) for Diabetes F/U with A1c in office, No previsit labs.   Ronny Bacon, [facility] [facility] [address] [place], [place] [address] Phone: [phone] Fax: [phone]  10/09/2023, 9:37 AM

## 2023-10-10 ENCOUNTER — Encounter: Payer: Self-pay | Admitting: Family Medicine

## 2023-10-10 ENCOUNTER — Encounter: Payer: Self-pay | Admitting: Nurse Practitioner

## 2023-10-10 LAB — CMP14+EGFR
ALT: 23 IU/L (ref 0–44)
AST: 20 IU/L (ref 0–40)
Albumin: 4.4 g/dL (ref 4.1–5.1)
Alkaline Phosphatase: 87 IU/L (ref 44–121)
BUN/Creatinine Ratio: 13 (ref 9–20)
BUN: 15 mg/dL (ref 6–24)
Bilirubin Total: 0.4 mg/dL (ref 0.0–1.2)
CO2: 27 mmol/L (ref 20–29)
Calcium: 9.7 mg/dL (ref 8.7–10.2)
Chloride: 100 mmol/L (ref 96–106)
Creatinine, Ser: 1.17 mg/dL (ref 0.76–1.27)
Globulin, Total: 2.4 g/dL (ref 1.5–4.5)
Glucose: 202 mg/dL — ABNORMAL HIGH (ref 70–99)
Potassium: 4.1 mmol/L (ref 3.5–5.2)
Sodium: 141 mmol/L (ref 134–144)
Total Protein: 6.8 g/dL (ref 6.0–8.5)
eGFR: 78 mL/min/{1.73_m2} (ref >59)

## 2023-10-10 LAB — MICROALBUMIN / CREATININE URINE RATIO
Creatinine, Urine: 58.3 mg/dL
Microalb/Creat Ratio: 7 mg/g{creat} (ref 0–29)
Microalbumin, Urine: 4 ug/mL

## 2023-10-10 LAB — TESTOSTERONE: Testosterone: 523 ng/dL (ref 264–916)

## 2023-10-10 LAB — CBC
Hematocrit: 42.3 % (ref 37.5–51.0)
Hemoglobin: 13.2 g/dL (ref 13.0–17.7)
MCH: 26 pg — ABNORMAL LOW (ref 26.6–33.0)
MCHC: 31.2 g/dL — ABNORMAL LOW (ref 31.5–35.7)
MCV: 83 fL (ref 79–97)
Platelets: 308 10*3/uL (ref 150–450)
RBC: 5.08 x10E6/uL (ref 4.14–5.80)
RDW: 14.7 % (ref 11.6–15.4)
WBC: 10.4 10*3/uL (ref 3.4–10.8)

## 2023-10-10 LAB — LIPID PANEL
Chol/HDL Ratio: 2 ratio (ref 0.0–5.0)
Cholesterol, Total: 115 mg/dL (ref 100–199)
HDL: 57 mg/dL (ref >39)
LDL Chol Calc (NIH): 46 mg/dL (ref 0–99)
Triglycerides: 50 mg/dL (ref 0–149)
VLDL Cholesterol Cal: 12 mg/dL (ref 5–40)

## 2023-10-10 MED ORDER — TIRZEPATIDE 5 MG/0.5ML ~~LOC~~ SOAJ: 5.0000 mg | SUBCUTANEOUS | 1 refills | Status: DC

## 2023-10-10 MED ORDER — OZEMPIC (0.25 OR 0.5 MG/DOSE) 2 MG/3ML ~~LOC~~ SOPN: 0.5000 mg | PEN_INJECTOR | SUBCUTANEOUS | 1 refills | Status: DC

## 2023-10-10 MED ORDER — TIRZEPATIDE 2.5 MG/0.5ML ~~LOC~~ SOAJ: 2.5000 mg | SUBCUTANEOUS | 0 refills | Status: DC

## 2023-10-10 NOTE — Addendum Note (Signed)
 Addended by: Dani Gobble on: 10/10/2023 08:42 AM   Modules accepted: Orders

## 2023-10-10 NOTE — Telephone Encounter (Signed)
 Absolutely.  Start at Ozempic 0.25 mg SQ weekly x 2 weeks, then increase to 0.5 mg SQ weekly thereafter.  I will send to pharmacy on file.

## 2023-10-11 NOTE — Telephone Encounter (Signed)
 Patient was called and made aware.

## 2023-10-31 ENCOUNTER — Other Ambulatory Visit (HOSPITAL_COMMUNITY): Payer: Self-pay

## 2023-10-31 ENCOUNTER — Telehealth: Payer: Self-pay

## 2023-10-31 NOTE — Telephone Encounter (Signed)
 Pharmacy Patient Advocate Encounter   Received notification from Patient Advice Request messages that prior authorization for Mounjaro is required/requested.   Insurance verification completed.   The patient is insured through [employer] [place]   .   Per test claim: ASK MEMBER FOR NEW CARD.  We will need updated insurance info before we can submit a PA

## 2023-11-19 ENCOUNTER — Other Ambulatory Visit (HOSPITAL_COMMUNITY): Payer: Self-pay

## 2023-11-20 ENCOUNTER — Telehealth: Payer: Self-pay

## 2023-11-20 ENCOUNTER — Other Ambulatory Visit: Payer: Self-pay | Admitting: Family Medicine

## 2023-11-20 NOTE — Telephone Encounter (Signed)
 Prescription Request  11/20/2023  LOV: Visit date not found  What is the name of the medication or equipment? Ozempic  (0.25&0.5 2mg /51ml Pen   Have you contacted your pharmacy to request a refill? Yes   Which pharmacy would you like this sent to?  [facility] [address], [place] - [address] [place] [address] Phone: [phone] Fax: [phone]    Patient notified that their request is being sent to the clinical staff for review and that they should receive a response within 2 business days.   Please advise at Mobile [phone] (mobile)

## 2023-11-21 NOTE — Telephone Encounter (Signed)
 Cook, Jayce G, DO     This is handled by Endo. Forward to [place].

## 2023-11-21 NOTE — Telephone Encounter (Signed)
 Pharmacist notified to send request to endocrinologist

## 2023-12-18 ENCOUNTER — Other Ambulatory Visit: Payer: Self-pay | Admitting: Family Medicine

## 2023-12-18 DIAGNOSIS — I10 Essential (primary) hypertension: Secondary | ICD-10-CM

## 2023-12-20 ENCOUNTER — Other Ambulatory Visit: Payer: Self-pay | Admitting: Family Medicine

## 2023-12-21 ENCOUNTER — Other Ambulatory Visit: Payer: Self-pay

## 2023-12-21 MED ORDER — MELOXICAM 15 MG PO TABS: 15.0000 mg | ORAL_TABLET | Freq: Every day | ORAL | 3 refills | Status: DC | PRN

## 2023-12-23 ENCOUNTER — Other Ambulatory Visit: Payer: Self-pay | Admitting: Family Medicine

## 2023-12-25 ENCOUNTER — Ambulatory Visit: Admitting: Family Medicine

## 2023-12-27 ENCOUNTER — Telehealth: Payer: Self-pay | Admitting: *Deleted

## 2023-12-27 NOTE — Telephone Encounter (Signed)
 May we ask for you to do the PA now, as the patient has  updated his insurance card.

## 2023-12-27 NOTE — Telephone Encounter (Signed)
 Patient was called and ask to bring a copy of his insurance information to us  or upload it in his MyChart. He stated that he was going to upload it, in a few minutes.

## 2023-12-27 NOTE — Telephone Encounter (Signed)
 His card is uploaded under Media

## 2023-12-28 ENCOUNTER — Other Ambulatory Visit (HOSPITAL_COMMUNITY): Payer: Self-pay

## 2023-12-28 ENCOUNTER — Telehealth: Payer: Self-pay

## 2023-12-28 NOTE — Telephone Encounter (Signed)
 Thank You.

## 2023-12-28 NOTE — Telephone Encounter (Signed)
 Pharmacy Patient Advocate Encounter   Received notification from Pt Calls Messages that prior authorization for Mounjaro  5mg  is required/requested.   Insurance verification completed.   The patient is insured through [employer] [place]  .   Per test claim: PA required; PA submitted to above mentioned insurance via CoverMyMeds Key/confirmation #/EOC [id number] Status is pending   Per PA form: Determination will be received by fax

## 2024-01-10 ENCOUNTER — Other Ambulatory Visit (HOSPITAL_COMMUNITY): Payer: Self-pay

## 2024-01-14 ENCOUNTER — Other Ambulatory Visit (HOSPITAL_COMMUNITY): Payer: Self-pay

## 2024-01-19 ENCOUNTER — Other Ambulatory Visit: Payer: Self-pay | Admitting: Family Medicine

## 2024-01-21 ENCOUNTER — Other Ambulatory Visit: Payer: Self-pay

## 2024-01-21 MED ORDER — TESTOSTERONE CYPIONATE 200 MG/ML IM SOLN: 100.0000 mg | INTRAMUSCULAR | 1 refills | Status: DC

## 2024-01-22 ENCOUNTER — Other Ambulatory Visit: Payer: Self-pay

## 2024-01-22 ENCOUNTER — Telehealth: Payer: Self-pay

## 2024-01-22 DIAGNOSIS — E291 Testicular hypofunction: Secondary | ICD-10-CM

## 2024-01-22 MED ORDER — TESTOSTERONE CYPIONATE 200 MG/ML IM SOLN: 100.0000 mg | INTRAMUSCULAR | 1 refills | Status: DC

## 2024-01-22 NOTE — Telephone Encounter (Signed)
 Communication  Reason for CRM:  Patient called in regards to the Renewed Testosterone  Cypionate 100 mg Intramuscular Every 7 days please if wondering if you can call the pharmacy please in regards to this medication [facility] in [place] to get more information, also sending my chart message.

## 2024-01-23 ENCOUNTER — Other Ambulatory Visit: Payer: Self-pay | Admitting: Family Medicine

## 2024-01-23 DIAGNOSIS — E291 Testicular hypofunction: Secondary | ICD-10-CM

## 2024-02-12 ENCOUNTER — Encounter: Payer: Self-pay | Admitting: Family Medicine

## 2024-02-12 ENCOUNTER — Ambulatory Visit (INDEPENDENT_AMBULATORY_CARE_PROVIDER_SITE_OTHER): Admitting: Nurse Practitioner

## 2024-02-12 ENCOUNTER — Encounter: Payer: Self-pay | Admitting: Nurse Practitioner

## 2024-02-12 ENCOUNTER — Telehealth: Payer: Self-pay | Admitting: Pharmacy Technician

## 2024-02-12 ENCOUNTER — Other Ambulatory Visit (HOSPITAL_COMMUNITY): Payer: Self-pay

## 2024-02-12 ENCOUNTER — Ambulatory Visit (INDEPENDENT_AMBULATORY_CARE_PROVIDER_SITE_OTHER): Admitting: Family Medicine

## 2024-02-12 VITALS — BP 136/82 | HR 80 | Ht 68.0 in | Wt 342.8 lb

## 2024-02-12 VITALS — BP 148/94 | HR 90 | Temp 98.2°F | Ht 68.0 in | Wt 340.0 lb

## 2024-02-12 DIAGNOSIS — Z7984 Long term (current) use of oral hypoglycemic drugs: Secondary | ICD-10-CM | POA: Diagnosis not present

## 2024-02-12 DIAGNOSIS — E119 Type 2 diabetes mellitus without complications: Secondary | ICD-10-CM

## 2024-02-12 DIAGNOSIS — M2550 Pain in unspecified joint: Secondary | ICD-10-CM | POA: Insufficient documentation

## 2024-02-12 DIAGNOSIS — E785 Hyperlipidemia, unspecified: Secondary | ICD-10-CM

## 2024-02-12 DIAGNOSIS — F419 Anxiety disorder, unspecified: Secondary | ICD-10-CM | POA: Diagnosis not present

## 2024-02-12 DIAGNOSIS — E782 Mixed hyperlipidemia: Secondary | ICD-10-CM | POA: Diagnosis not present

## 2024-02-12 DIAGNOSIS — I10 Essential (primary) hypertension: Secondary | ICD-10-CM | POA: Diagnosis not present

## 2024-02-12 DIAGNOSIS — M25541 Pain in joints of right hand: Secondary | ICD-10-CM

## 2024-02-12 DIAGNOSIS — E1165 Type 2 diabetes mellitus with hyperglycemia: Secondary | ICD-10-CM | POA: Diagnosis not present

## 2024-02-12 DIAGNOSIS — E291 Testicular hypofunction: Secondary | ICD-10-CM

## 2024-02-12 DIAGNOSIS — F32A Depression, unspecified: Secondary | ICD-10-CM

## 2024-02-12 DIAGNOSIS — M25542 Pain in joints of left hand: Secondary | ICD-10-CM

## 2024-02-12 LAB — POCT GLYCOSYLATED HEMOGLOBIN (HGB A1C): Hemoglobin A1C: 7.4 % — AB (ref 4.0–5.6)

## 2024-02-12 MED ORDER — AMLODIPINE BESYLATE 10 MG PO TABS
10.0000 mg | ORAL_TABLET | Freq: Every day | ORAL | 3 refills | Status: AC
Start: 2024-02-12 — End: ?

## 2024-02-12 MED ORDER — METFORMIN HCL 1000 MG PO TABS: 1000.0000 mg | ORAL_TABLET | Freq: Two times a day (BID) | ORAL | 3 refills | Status: AC

## 2024-02-12 MED ORDER — TESTOSTERONE CYPIONATE 200 MG/ML IM SOLN: 100.0000 mg | INTRAMUSCULAR | 1 refills | Status: AC

## 2024-02-12 MED ORDER — SEMAGLUTIDE (1 MG/DOSE) 4 MG/3ML ~~LOC~~ SOPN: 1.0000 mg | PEN_INJECTOR | SUBCUTANEOUS | 1 refills | Status: DC

## 2024-02-12 MED ORDER — ESCITALOPRAM OXALATE 10 MG PO TABS
10.0000 mg | ORAL_TABLET | Freq: Every day | ORAL | 3 refills | Status: AC
Start: 2024-02-12 — End: ?

## 2024-02-12 MED ORDER — DICLOFENAC SODIUM 75 MG PO TBEC: 75.0000 mg | DELAYED_RELEASE_TABLET | Freq: Two times a day (BID) | ORAL | 0 refills | Status: AC | PRN

## 2024-02-12 NOTE — Progress Notes (Signed)
 Endocrinology Follow Up Note       02/12/2024, 10:11 AM   Subjective:    Patient ID: Joshua Chang, male    DOB: [birth year].  Joshua Chang is being seen in follow up after being seen in consultation for management of currently uncontrolled symptomatic diabetes requested by  Cook, Jayce G, DO.   Past Medical History:  Diagnosis Date   Anxiety    Depression    Diabetes mellitus without complication (HCC)    Hyperlipidemia    Hypertension    Hypogonadism in male     Past Surgical History:  Procedure Laterality Date   TYMPANOSTOMY TUBE PLACEMENT      Social History   Socioeconomic History   Marital status: Married    Spouse name: Not on file   Number of children: Not on file   Years of education: Not on file   Highest education level: GED or equivalent  Occupational History   Not on file  Tobacco Use   Smoking status: Former    Current packs/day: 0.00    Types: Cigarettes    Quit date: 2022    Years since quitting: 3.6   Smokeless tobacco: Never  Vaping Use   Vaping status: Former  Substance and Sexual Activity   Alcohol use: No   Drug use: No   Sexual activity: Not on file  Other Topics Concern   Not on file  Social History Narrative   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   Social Drivers of Corporate investment banker Strain: Low Risk  (02/11/2024)   Overall Financial Resource Strain (CARDIA)    Difficulty of Paying Living Expenses: Not hard at all  Food Insecurity: No Food Insecurity (02/11/2024)   Hunger Vital Sign    Worried About Running Out of Food in the Last Year: Never true    Ran Out of Food in the Last Year: Never true  Transportation Needs: No Transportation Needs (02/11/2024)   PRAPARE - Administrator, Civil Service (Medical): No    Lack of Transportation (Non-Medical): No  Physical Activity: Insufficiently  Active (02/11/2024)   Exercise Vital Sign    Days of Exercise per Week: 2 days    Minutes of Exercise per Session: 30 min  Stress: Stress Concern Present (02/11/2024)   [employer] of Occupational Health - Occupational Stress Questionnaire    Feeling of Stress: Rather much  Social Connections: Moderately Integrated (02/11/2024)   Social Connection and Isolation Panel    Frequency of Communication with Friends and Family: Twice a week    Frequency of Social Gatherings with Friends and Family: Once a week    Attends Religious Services: More than 4 times per year    Active Member of [employer] or Organizations: No    Attends Engineer, structural: Not on file    Marital Status: Married    Family History  Problem Relation Age of Onset   Depression Mother    Hyperlipidemia Mother    Hypertension Father    COPD Father    Cancer Father    Thyroid disease Sister     Outpatient Encounter Medications as  of 02/12/2024  Medication Sig   ALPRAZolam  (XANAX ) 0.5 MG tablet Take 1 tablet (0.5 mg total) by mouth 2 (two) times daily as needed for anxiety.   amLODipine  (NORVASC ) 10 MG tablet Take 1 tablet (10 mg total) by mouth daily.   Cholecalciferol (VITAMIN D3) 125 MCG (5000 UT) TABS Take 2 tablets by mouth daily.   diclofenac  (VOLTAREN ) 75 MG EC tablet Take 1 tablet (75 mg total) by mouth 2 (two) times daily as needed for moderate pain (pain score 4-6).   escitalopram  (LEXAPRO ) 10 MG tablet Take 1 tablet (10 mg total) by mouth daily.   losartan -hydrochlorothiazide (HYZAAR) 100-25 MG tablet Take 1 tablet by mouth once daily   rosuvastatin  (CRESTOR ) 40 MG tablet Take 1 tablet (40 mg total) by mouth daily.   Semaglutide , 1 MG/DOSE, 4 MG/3ML SOPN Inject 1 mg as directed once a week.   sildenafil  (VIAGRA ) 50 MG tablet Take 1-2 tablets (50-100 mg total) by mouth daily as needed for erectile dysfunction.   testosterone  cypionate (DEPO-TESTOSTERONE ) 200 MG/ML injection Inject 0.5 mLs (100 mg  total) into the muscle every 7 (seven) days.   traZODone  (DESYREL ) 50 MG tablet Take 1 tablet (50 mg total) by mouth at bedtime as needed for sleep.   [DISCONTINUED] metFORMIN  (GLUCOPHAGE ) 1000 MG tablet Take 1 tablet (1,000 mg total) by mouth 2 (two) times daily with a meal.   [DISCONTINUED] tirzepatide  (MOUNJARO ) 5 MG/0.5ML Pen Inject 5 mg into the skin once a week.   metFORMIN  (GLUCOPHAGE ) 1000 MG tablet Take 1 tablet (1,000 mg total) by mouth 2 (two) times daily with a meal.   No facility-administered encounter medications on file as of 02/12/2024.    ALLERGIES: No Known Allergies  VACCINATION STATUS: Immunization History  Administered Date(s) Administered   Influenza, Seasonal, Injecte, Preservative Fre 03/27/2023   Influenza,inj,Quad PF,6+ Mos 03/15/2019, 03/21/2020   Moderna Sars-Covid-2 Vaccination 09/13/2019, 10/15/2019, 08/06/2020    Diabetes He presents for his follow-up diabetic visit. He has type 2 diabetes mellitus. Onset time: Diagnosed at approx age of 25. His disease course has been worsening. There are no hypoglycemic associated symptoms. There are no diabetic associated symptoms. There are no hypoglycemic complications. Symptoms are stable. Diabetic complications include impotence. Risk factors for coronary artery disease include diabetes mellitus, dyslipidemia, family history, male sex, obesity and sedentary lifestyle. Current diabetic treatment includes oral agent (monotherapy) (and Ozempic ). He is compliant with treatment most of the time. His weight is fluctuating minimally. He is following a generally unhealthy diet. When asked about meal planning, he reported none. He has not had a previous visit with a dietitian. He participates in exercise intermittently. (He presents today with no meter or logs to review (was not asked to routinely monitor).  His POCT A1c today is 7.4%, improving from last visit of 8.4%. He has tolerated the Ozempic  well, no unpleasant side effects.   He does note his appetite is creeping back in now.) An ACE inhibitor/angiotensin II receptor blocker is being taken. He does not see a podiatrist.Eye exam is current.     Review of systems  Constitutional: + stable body weight, current Body mass index is 52.12 kg/m., no fatigue, no subjective hyperthermia, no subjective hypothermia Eyes: no blurry vision, no xerophthalmia ENT: no sore throat, no nodules palpated in throat, no dysphagia/odynophagia, no hoarseness Cardiovascular: no chest pain, no shortness of breath, no palpitations, no leg swelling Respiratory: no cough, no shortness of breath Gastrointestinal: no nausea/vomiting/diarrhea Musculoskeletal: no muscle/joint aches Skin: no rashes, no hyperemia  Neurological: no tremors, no numbness, no tingling, no dizziness Psychiatric: no depression, no anxiety  Objective:     BP 136/82 (BP Location: Left Arm, Patient Position: Sitting, Cuff Size: Large)   Pulse 80   Ht 5' 8 (1.727 m)   Wt (!) 342 lb 12.8 oz (155.5 kg)   BMI 52.12 kg/m   Wt Readings from Last 3 Encounters:  02/12/24 (!) 342 lb 12.8 oz (155.5 kg)  02/12/24 (!) 340 lb (154.2 kg)  10/09/23 (!) 346 lb 9.6 oz (157.2 kg)     BP Readings from Last 3 Encounters:  02/12/24 136/82  02/12/24 (!) 148/94  10/09/23 130/82      Physical Exam- Limited  Constitutional:  Body mass index is 52.12 kg/m. , not in acute distress, normal state of mind Eyes:  EOMI, no exophthalmos Musculoskeletal: no gross deformities, strength intact in all four extremities, no gross restriction of joint movements Skin:  no rashes, no hyperemia Neurological: no tremor with outstretched hands   Diabetic Foot Exam - Simple   No data filed     CMP ( most recent) CMP     Component Value Date/Time   NA 141 10/09/2023 0954   K 4.1 10/09/2023 0954   CL 100 10/09/2023 0954   CO2 27 10/09/2023 0954   GLUCOSE 202 (H) 10/09/2023 0954   GLUCOSE 136 (H) 02/07/2021 2240   BUN 15 10/09/2023  0954   CREATININE 1.17 10/09/2023 0954   CREATININE 1.13 09/21/2020 0827   CALCIUM  9.7 10/09/2023 0954   PROT 6.8 10/09/2023 0954   ALBUMIN 4.4 10/09/2023 0954   AST 20 10/09/2023 0954   ALT 23 10/09/2023 0954   ALKPHOS 87 10/09/2023 0954   BILITOT 0.4 10/09/2023 0954   EGFR 78 10/09/2023 0954   GFRNONAA >60 02/07/2021 2240   GFRNONAA 79 09/21/2020 0827     Diabetic Labs (most recent): Lab Results  Component Value Date   HGBA1C 7.4 (A) 02/12/2024   HGBA1C 8.4 (A) 10/09/2023   HGBA1C 7.6 (A) 07/06/2023     Lipid Panel ( most recent) Lipid Panel     Component Value Date/Time   CHOL 115 10/09/2023 0954   TRIG 50 10/09/2023 0954   HDL 57 10/09/2023 0954   CHOLHDL 2.0 10/09/2023 0954   CHOLHDL 3.9 09/21/2020 0827   LDLCALC 46 10/09/2023 0954   LDLCALC 122 (H) 09/21/2020 0827   LABVLDL 12 10/09/2023 0954      Lab Results  Component Value Date   TSH 1.27 10/15/2019           Assessment & Plan:   1) Type 2 diabetes mellitus with hyperglycemia, without long-term current use of insulin (HCC)  He presents today with no meter or logs to review (was not asked to routinely monitor).  His POCT A1c today is 7.4%, improving from last visit of 8.4%. He has tolerated the Ozempic  well, no unpleasant side effects.  He does note his appetite is creeping back in now.  - Joshua Chang has currently uncontrolled symptomatic type 2 DM since 48 years of age.   -Recent labs reviewed.  - I had a long discussion with him about the progressive nature of diabetes and the pathology behind its complications. -his diabetes is complicated by ED and he remains at a high risk for more acute and chronic complications which include CAD, CVA, CKD, retinopathy, and neuropathy. These are all discussed in detail with him.  The following Lifestyle Medicine recommendations according to [facility] of Lifestyle  Medicine [facility]) were discussed and offered to patient and he agrees to start the  journey:  A. Whole Foods, Plant-based plate comprising of fruits and vegetables, plant-based proteins, whole-grain carbohydrates was discussed in detail with the patient.   A list for source of those nutrients were also provided to the patient.  Patient will use only water or unsweetened tea for hydration. B.  The need to stay away from risky substances including alcohol, smoking; obtaining 7 to 9 hours of restorative sleep, at least 150 minutes of moderate intensity exercise weekly, the importance of healthy social connections,  and stress reduction techniques were discussed. C.  A full color page of  Calorie density of various food groups per pound showing examples of each food groups was provided to the patient.  - Nutritional counseling repeated at each appointment due to patients tendency to fall back in to old habits.  - The patient admits there is a room for improvement in their diet and drink choices. -  Suggestion is made for the patient to avoid simple carbohydrates from their diet including Cakes, Sweet Desserts / Pastries, Ice Cream, Soda (diet and regular), Sweet Tea, Candies, Chips, Cookies, Sweet Pastries, Store Bought Juices, Alcohol in Excess of 1-2 drinks a day, Artificial Sweeteners, Coffee Creamer, and Sugar-free Products. This will help patient to have stable blood glucose profile and potentially avoid unintended weight gain.   - I encouraged the patient to switch to unprocessed or minimally processed complex starch and increased protein intake (animal or plant source), fruits, and vegetables.   - Patient is advised to stick to a routine mealtimes to eat 3 meals a day and avoid unnecessary snacks (to snack only to correct hypoglycemia).  - I have approached him with the following individualized plan to manage his diabetes and patient agrees:   - he is advised to continue Metformin  1000 mg po twice daily after meals, therapeutically suitable for patient .  I increased his  Ozempic  to 1 mg SQ weekly today.  -he is encouraged to continue monitoring fasting glucose 2-3 times per week and to call the clinic if his readings are less than 70 or above 200 for 3 tests in a row.    - Adjustment parameters are given to him for hypo and hyperglycemia in writing.  - Specific targets for  A1c; LDL, HDL, and Triglycerides were discussed with the patient.  2) Blood Pressure /Hypertension:  his blood pressure is controlled to target.   he is advised to continue his current medications as prescribed by his PCP.  3) Lipids/Hyperlipidemia:    Review of his recent lipid panel from 10/09/23 showed controlled LDL at 46 .  he is advised to continue Crestor  10 mg daily at bedtime.  Side effects and precautions discussed with him.  4)  Weight/Diet:  his Body mass index is 52.12 kg/m.  -  clearly complicating his diabetes care.   he is a candidate for weight loss. I discussed with him the fact that loss of 5 - 10% of his  current body weight will have the most impact on his diabetes management.  Exercise, and detailed carbohydrates information provided  -  detailed on discharge instructions.  He asked me about weight loss surgery.  I advised him to do his research and weigh out the risks vs benefits.  This is irreversible and may cause complications down the road.  We talked about potentially trying GLP1 product first, if covered by his insurance.  5) Chronic Care/Health  Maintenance: -he is on ACEI/ARB and Statin medications and is encouraged to initiate and continue to follow up with Ophthalmology, Dentist, Podiatrist at least yearly or according to recommendations, and advised to stay away from smoking (quit smoking about 2 years ago). I have recommended yearly flu vaccine and pneumonia vaccine at least every 5 years; moderate intensity exercise for up to 150 minutes weekly; and sleep for at least 7 hours a day.  - he is advised to maintain close follow up with Cook, Jayce G, DO for  primary care needs, as well as his other providers for optimal and coordinated care.     I spent  32  minutes in the care of the patient today including review of labs from CMP, Lipids, Thyroid Function, Hematology (current and previous including abstractions from other facilities); face-to-face time discussing  his blood glucose readings/logs, discussing hypoglycemia and hyperglycemia episodes and symptoms, medications doses, his options of short and long term treatment based on the latest standards of care / guidelines;  discussion about incorporating lifestyle medicine;  and documenting the encounter. Risk reduction counseling performed per USPSTF guidelines to reduce obesity and cardiovascular risk factors.     Please refer to Patient Instructions for Blood Glucose Monitoring and Insulin/Medications Dosing Guide  in media tab for additional information. Please  also refer to  Patient Self Inventory in the Media  tab for reviewed elements of pertinent patient history.  Joshua Chang participated in the discussions, expressed understanding, and voiced agreement with the above plans.  All questions were answered to his satisfaction. he is encouraged to contact clinic should he have any questions or concerns prior to his return visit.     Follow up plan: - Return in about 4 months (around 06/13/2024) for Diabetes F/U with A1c in office, No previsit labs.   Benton Rio, [facility] [address] [place], [place] [address] Phone: [phone] Fax: [phone]  02/12/2024, 10:11 AM

## 2024-02-12 NOTE — Assessment & Plan Note (Signed)
 BP mildly elevated here today.  Amlodipine  refilled.  Continue amlodipine  as well as losartan /HCTZ.

## 2024-02-12 NOTE — Telephone Encounter (Signed)
 Pharmacy Patient Advocate Encounter   Received notification from CoverMyMeds that prior authorization for Testosterone  Cypionate 200MG /ML intramuscular solution is required/requested.   Insurance verification completed.   The patient is insured through Rx Preferred Benefits .   Contacted insurance and I am waiting on the Prior Authorization form to be faxed over. [phone]

## 2024-02-12 NOTE — Progress Notes (Signed)
 Subjective:  Patient ID: Joshua Chang, male    DOB: 1975-09-04  Age: 48 y.o. MRN: [id number]  CC:   Chief Complaint  Patient presents with   Follow-up    Anxiety and depression, dm2, hyperlipidemia, hypertension     HPI:  48 year old presents for follow-up.  Patient's blood pressure is mildly elevated here today.  Unsure of compliance with this medication based on the EMR.  He is currently on amlodipine  10 mg daily and losartan /HCTZ 100/25 daily.  Patient states that his stress levels have improved.  He states that he feels like he is doing well at this time.  Patient is on Lexapro .  Previously prescribed alprazolam .  Has not been prescribed this in several months.  Patient follows with endocrinology regarding type 2 diabetes.  He is currently on semaglutide  and metformin .  Last A1c was 8.4.  Lipids have been well-controlled.  Last LDL 46.  Patient is on max dose of Crestor .  Patient has hypogonadism.  Needs refill on testosterone .  He has been out for approximately 3 weeks.  Patient is having pain of his hands.  He has been using meloxicam , naproxen and Goody powder.  Will discuss today.  Patient Active Problem List   Diagnosis Date Noted   Arthralgia 02/12/2024   Anxiety and depression 09/26/2023   Arthralgia of right hand 03/27/2023   Insomnia 03/27/2023   Erectile dysfunction 12/30/2021   Morbid obesity (HCC) 09/29/2021   Hyperlipidemia 09/29/2021   Hypogonadism in male 09/29/2021   Type 2 diabetes mellitus without complication, without long-term current use of insulin (HCC) 09/29/2021   Essential hypertension, benign 05/29/2012    Social Hx   Social History   Socioeconomic History   Marital status: Married    Spouse name: Not on file   Number of children: Not on file   Years of education: Not on file   Highest education level: GED or equivalent  Occupational History   Not on file  Tobacco Use   Smoking status: Former    Current packs/day: 0.00     Types: Cigarettes    Quit date: 2022    Years since quitting: 3.6   Smokeless tobacco: Never  Vaping Use   Vaping status: Former  Substance and Sexual Activity   Alcohol use: No   Drug use: No   Sexual activity: Not on file  Other Topics Concern   Not on file  Social History Narrative   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   Social Drivers of Corporate investment banker Strain: Low Risk  (02/11/2024)   Overall Financial Resource Strain (CARDIA)    Difficulty of Paying Living Expenses: Not hard at all  Food Insecurity: No Food Insecurity (02/11/2024)   Hunger Vital Sign    Worried About Running Out of Food in the Last Year: Never true    Ran Out of Food in the Last Year: Never true  Transportation Needs: No Transportation Needs (02/11/2024)   PRAPARE - Administrator, Civil Service (Medical): No    Lack of Transportation (Non-Medical): No  Physical Activity: Insufficiently Active (02/11/2024)   Exercise Vital Sign    Days of Exercise per Week: 2 days    Minutes of Exercise per Session: 30 min  Stress: Stress Concern Present (02/11/2024)   [employer] of Occupational Health - Occupational Stress Questionnaire    Feeling of Stress: Rather much  Social Connections: Moderately Integrated (02/11/2024)   Social Connection and Isolation  Panel    Frequency of Communication with Friends and Family: Twice a week    Frequency of Social Gatherings with Friends and Family: Once a week    Attends Religious Services: More than 4 times per year    Active Member of [employer] or Organizations: No    Attends Engineer, structural: Not on file    Marital Status: Married    Review of Systems Per HPI  Objective:  BP (!) 148/94   Pulse 90   Temp 98.2 F (36.8 C)   Ht 5' 8 (1.727 m)   Wt (!) 340 lb (154.2 kg)   SpO2 100%   BMI 51.70 kg/m      02/12/2024    9:31 AM 02/12/2024    9:01 AM 02/12/2024    8:26 AM  BP/Weight   Systolic BP 136 148 164  Diastolic BP 82 94 122  Wt. (Lbs) 342.8  340  BMI 52.12 kg/m2  51.7 kg/m2    Physical Exam Vitals and nursing note reviewed.  Constitutional:      General: He is not in acute distress.    Appearance: Normal appearance. He is obese.  HENT:     Head: Normocephalic and atraumatic.  Eyes:     General:        Right eye: No discharge.        Left eye: No discharge.     Conjunctiva/sclera: Conjunctivae normal.  Cardiovascular:     Rate and Rhythm: Normal rate and regular rhythm.  Pulmonary:     Effort: Pulmonary effort is normal.     Breath sounds: Normal breath sounds. No wheezing, rhonchi or rales.  Neurological:     Mental Status: He is alert.  Psychiatric:        Mood and Affect: Mood normal.        Behavior: Behavior normal.     Lab Results  Component Value Date   WBC 10.4 10/09/2023   HGB 13.2 10/09/2023   HCT 42.3 10/09/2023   PLT 308 10/09/2023   GLUCOSE 202 (H) 10/09/2023   CHOL 115 10/09/2023   TRIG 50 10/09/2023   HDL 57 10/09/2023   LDLCALC 46 10/09/2023   ALT 23 10/09/2023   AST 20 10/09/2023   NA 141 10/09/2023   K 4.1 10/09/2023   CL 100 10/09/2023   CREATININE 1.17 10/09/2023   BUN 15 10/09/2023   CO2 27 10/09/2023   TSH 1.27 10/15/2019   HGBA1C 7.4 (A) 02/12/2024     Assessment & Plan:  Essential hypertension, benign Assessment & Plan: BP mildly elevated here today.  Amlodipine  refilled.  Continue amlodipine  as well as losartan /HCTZ.  Orders: -     amLODIPine  Besylate; Take 1 tablet (10 mg total) by mouth daily.  Dispense: 90 tablet; Refill: 3 -     CMP14+EGFR  Hypogonadism in male Assessment & Plan: Testosterone  refilled.  Orders: -     Testosterone  Cypionate; Inject 0.5 mLs (100 mg total) into the muscle every 7 (seven) days.  Dispense: 4 mL; Refill: 1  Anxiety and depression -     Escitalopram  Oxalate; Take 1 tablet (10 mg total) by mouth daily.  Dispense: 90 tablet; Refill: 3  Type 2 diabetes mellitus  without complication, without long-term current use of insulin (HCC) Assessment & Plan: Needs A1c.  Currently on metformin  and Ozempic .  Continue follow-up with endocrinology.   Hyperlipidemia, unspecified hyperlipidemia type Assessment & Plan: At goal.  Continue Crestor .  Orders: -  Lipid panel  Arthralgia of both hands Assessment & Plan: Advised to avoid simultaneous use of multiple NSAIDs.  Stopping meloxicam .  Advised to use diclofenac  as prescribed.  Avoid Goody powder/BC powder.  Orders: -     Diclofenac  Sodium; Take 1 tablet (75 mg total) by mouth 2 (two) times daily as needed for moderate pain (pain score 4-6).  Dispense: 60 tablet; Refill: 0    Follow-up: 3 to 6 months  Glenda Spelman Bluford DO [facility]

## 2024-02-12 NOTE — Assessment & Plan Note (Signed)
At goal.  Continue Crestor. 

## 2024-02-12 NOTE — Assessment & Plan Note (Signed)
 Testosterone  refilled

## 2024-02-12 NOTE — Patient Instructions (Signed)
 Labs today.  Medications sent in.  Follow up in 3-6 months.  Take care  Dr. Bluford

## 2024-02-12 NOTE — Telephone Encounter (Signed)
 PA form, chart notes, and labs have been submitted via fax today at 1:00pm.  Fax# [phone]

## 2024-02-12 NOTE — Assessment & Plan Note (Signed)
 Advised to avoid simultaneous use of multiple NSAIDs.  Stopping meloxicam .  Advised to use diclofenac  as prescribed.  Avoid Goody powder/BC powder.

## 2024-02-12 NOTE — Assessment & Plan Note (Signed)
 Needs A1c.  Currently on metformin  and Ozempic .  Continue follow-up with endocrinology.

## 2024-02-13 NOTE — Telephone Encounter (Signed)
 Pharmacy Patient Advocate Encounter  Received notification from Rx Preferred Benefits that Prior Authorization for Testosterone  Cypionate 200MG /ML intramuscular solution has been DENIED.  Full denial letter will be uploaded to the media tab. See denial reason below.   Called [facility] and they advised that patient always fills the medication using a Goodrx coupon. They will fill as before.

## 2024-04-03 ENCOUNTER — Other Ambulatory Visit: Payer: Self-pay | Admitting: Family Medicine

## 2024-04-03 DIAGNOSIS — I10 Essential (primary) hypertension: Secondary | ICD-10-CM

## 2024-05-09 LAB — CMP14+EGFR
ALT: 27 IU/L (ref 0–44)
AST: 25 IU/L (ref 0–40)
Albumin: 4.2 g/dL (ref 4.1–5.1)
Alkaline Phosphatase: 91 IU/L (ref 47–123)
BUN/Creatinine Ratio: 15 (ref 9–20)
BUN: 17 mg/dL (ref 6–24)
Bilirubin Total: 0.6 mg/dL (ref 0.0–1.2)
CO2: 23 mmol/L (ref 20–29)
Calcium: 9.8 mg/dL (ref 8.7–10.2)
Chloride: 100 mmol/L (ref 96–106)
Creatinine, Ser: 1.14 mg/dL (ref 0.76–1.27)
Globulin, Total: 2.7 g/dL (ref 1.5–4.5)
Glucose: 158 mg/dL — ABNORMAL HIGH (ref 70–99)
Potassium: 4.4 mmol/L (ref 3.5–5.2)
Sodium: 140 mmol/L (ref 134–144)
Total Protein: 6.9 g/dL (ref 6.0–8.5)
eGFR: 80 mL/min/1.73 (ref >59)

## 2024-05-09 LAB — LIPID PANEL
Chol/HDL Ratio: 2.5 ratio (ref 0.0–5.0)
Cholesterol, Total: 119 mg/dL (ref 100–199)
HDL: 47 mg/dL (ref >39)
LDL Chol Calc (NIH): 59 mg/dL (ref 0–99)
Triglycerides: 57 mg/dL (ref 0–149)
VLDL Cholesterol Cal: 13 mg/dL (ref 5–40)

## 2024-05-11 ENCOUNTER — Ambulatory Visit: Payer: Self-pay | Admitting: Family Medicine

## 2024-06-03 ENCOUNTER — Other Ambulatory Visit: Payer: Self-pay | Admitting: Family Medicine

## 2024-06-03 DIAGNOSIS — F419 Anxiety disorder, unspecified: Secondary | ICD-10-CM

## 2024-06-17 ENCOUNTER — Ambulatory Visit: Admitting: Nurse Practitioner

## 2024-06-17 ENCOUNTER — Encounter: Payer: Self-pay | Admitting: Nurse Practitioner

## 2024-06-17 VITALS — BP 118/70 | HR 78 | Ht 68.0 in | Wt 317.6 lb

## 2024-06-17 DIAGNOSIS — E1165 Type 2 diabetes mellitus with hyperglycemia: Secondary | ICD-10-CM

## 2024-06-17 DIAGNOSIS — Z7984 Long term (current) use of oral hypoglycemic drugs: Secondary | ICD-10-CM | POA: Diagnosis not present

## 2024-06-17 DIAGNOSIS — E782 Mixed hyperlipidemia: Secondary | ICD-10-CM | POA: Diagnosis not present

## 2024-06-17 LAB — POCT GLYCOSYLATED HEMOGLOBIN (HGB A1C): Hemoglobin A1C: 6.9 % — AB (ref 4.0–5.6)

## 2024-06-17 MED ORDER — SEMAGLUTIDE (2 MG/DOSE) 8 MG/3ML ~~LOC~~ SOPN: 2.0000 mg | PEN_INJECTOR | SUBCUTANEOUS | 3 refills | Status: AC

## 2024-06-17 NOTE — Progress Notes (Signed)
 Endocrinology Follow Up Note       06/17/2024, 9:51 AM   Subjective:    Patient ID: Joshua Chang, male    DOB: 1976/05/23.  Joshua Chang is being seen in follow up after being seen in consultation for management of currently uncontrolled symptomatic diabetes requested by  Cook, Jayce G, DO.   Past Medical History:  Diagnosis Date   Anxiety    Depression    Diabetes mellitus without complication (HCC)    Hyperlipidemia    Hypertension    Hypogonadism in male     Past Surgical History:  Procedure Laterality Date   TYMPANOSTOMY TUBE PLACEMENT      Social History   Socioeconomic History   Marital status: Married    Spouse name: Not on file   Number of children: Not on file   Years of education: Not on file   Highest education level: GED or equivalent  Occupational History   Not on file  Tobacco Use   Smoking status: Former    Current packs/day: 0.00    Types: Cigarettes    Quit date: 2022    Years since quitting: 3.9   Smokeless tobacco: Never  Vaping Use   Vaping status: Former  Substance and Sexual Activity   Alcohol use: No   Drug use: No   Sexual activity: Not on file  Other Topics Concern   Not on file  Social History Narrative   Married for 15 years.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   Social Drivers of Health   Tobacco Use: Medium Risk (06/17/2024)   Patient History    Smoking Tobacco Use: Former    Smokeless Tobacco Use: Never    Passive Exposure: Not on file  Financial Resource Strain: Low Risk (02/11/2024)   Overall Financial Resource Strain (CARDIA)    Difficulty of Paying Living Expenses: Not hard at all  Food Insecurity: No Food Insecurity (02/11/2024)   Epic    Worried About Programme Researcher, Broadcasting/film/video in the Last Year: Never true    Ran Out of Food in the Last Year: Never true  Transportation Needs: No Transportation Needs (02/11/2024)   Epic     Lack of Transportation (Medical): No    Lack of Transportation (Non-Medical): No  Physical Activity: Insufficiently Active (02/11/2024)   Exercise Vital Sign    Days of Exercise per Week: 2 days    Minutes of Exercise per Session: 30 min  Stress: Stress Concern Present (02/11/2024)   [employer] of Occupational Health - Occupational Stress Questionnaire    Feeling of Stress: Rather much  Social Connections: Moderately Integrated (02/11/2024)   Social Connection and Isolation Panel    Frequency of Communication with Friends and Family: Twice a week    Frequency of Social Gatherings with Friends and Family: Once a week    Attends Religious Services: More than 4 times per year    Active Member of Clubs or Organizations: No    Attends Banker Meetings: Not on file    Marital Status: Married  Depression (PHQ2-9): High Risk (02/12/2024)   Depression (PHQ2-9)    PHQ-2 Score: 12  Alcohol Screen: Low Risk (02/11/2024)  Alcohol Screen    Last Alcohol Screening Score (AUDIT): 0  Housing: Low Risk (02/11/2024)   Epic    Unable to Pay for Housing in the Last Year: No    Number of Times Moved in the Last Year: 1    Homeless in the Last Year: No  Utilities: Not on file  Health Literacy: Not on file    Family History  Problem Relation Age of Onset   Depression Mother    Hyperlipidemia Mother    Hypertension Father    COPD Father    Cancer Father    Thyroid disease Sister     Outpatient Encounter Medications as of 06/17/2024  Medication Sig   ALPRAZolam  (XANAX ) 0.5 MG tablet Take 1 tablet by mouth twice daily as needed for anxiety   amLODipine  (NORVASC ) 10 MG tablet Take 1 tablet (10 mg total) by mouth daily.   Cholecalciferol (VITAMIN D3) 125 MCG (5000 UT) TABS Take 2 tablets by mouth daily.   diclofenac  (VOLTAREN ) 75 MG EC tablet Take 1 tablet (75 mg total) by mouth 2 (two) times daily as needed for moderate pain (pain score 4-6).   escitalopram  (LEXAPRO ) 10 MG  tablet Take 1 tablet (10 mg total) by mouth daily.   losartan -hydrochlorothiazide (HYZAAR) 100-25 MG tablet Take 1 tablet by mouth once daily   metFORMIN  (GLUCOPHAGE ) 1000 MG tablet Take 1 tablet (1,000 mg total) by mouth 2 (two) times daily with a meal.   rosuvastatin  (CRESTOR ) 40 MG tablet Take 1 tablet (40 mg total) by mouth daily.   Semaglutide , 2 MG/DOSE, 8 MG/3ML SOPN Inject 2 mg as directed once a week.   sildenafil  (VIAGRA ) 50 MG tablet Take 1-2 tablets (50-100 mg total) by mouth daily as needed for erectile dysfunction.   testosterone  cypionate (DEPO-TESTOSTERONE ) 200 MG/ML injection Inject 0.5 mLs (100 mg total) into the muscle every 7 (seven) days.   traZODone  (DESYREL ) 50 MG tablet Take 1 tablet (50 mg total) by mouth at bedtime as needed for sleep.   [DISCONTINUED] Semaglutide , 1 MG/DOSE, 4 MG/3ML SOPN Inject 1 mg as directed once a week.   No facility-administered encounter medications on file as of 06/17/2024.    ALLERGIES: No Known Allergies  VACCINATION STATUS: Immunization History  Administered Date(s) Administered   Influenza, Seasonal, Injecte, Preservative Fre 03/27/2023   Influenza,inj,Quad PF,6+ Mos 03/15/2019, 03/21/2020   Moderna Sars-Covid-2 Vaccination 09/13/2019, 10/15/2019, 08/06/2020    Diabetes He presents for his follow-up diabetic visit. He has type 2 diabetes mellitus. Onset time: Diagnosed at approx age of 48. His disease course has been improving. There are no hypoglycemic associated symptoms. There are no diabetic associated symptoms. There are no hypoglycemic complications. Diabetic complications include impotence. Risk factors for coronary artery disease include diabetes mellitus, dyslipidemia, family history, male sex, obesity and sedentary lifestyle. Current diabetic treatment includes oral agent (monotherapy) (and Ozempic ). He is compliant with treatment most of the time. His weight is decreasing steadily. He is following a generally healthy diet.  When asked about meal planning, he reported none. He has not had a previous visit with a dietitian. He participates in exercise intermittently. (He presents today with no meter or logs to review (was not asked to routinely monitor).  His POCT A1c today is 6.9%, improving from last visit of 7.4%. He has tolerated the Ozempic  well, no unpleasant side effects.  Has lost 25 lbs since last visit and is overall feeling well.) An ACE inhibitor/angiotensin II receptor blocker is being taken. He does not see  a podiatrist.Eye exam is current.     Review of systems  Constitutional: + decreasing body weight,  current Body mass index is 48.29 kg/m. , no fatigue, no subjective hyperthermia, no subjective hypothermia Eyes: no blurry vision, no xerophthalmia ENT: no sore throat, no nodules palpated in throat, no dysphagia/odynophagia, no hoarseness Cardiovascular: no chest pain, no shortness of breath, no palpitations, no leg swelling Respiratory: no cough, no shortness of breath Gastrointestinal: no nausea/vomiting/diarrhea Musculoskeletal: no muscle/joint aches Skin: no rashes, no hyperemia Neurological: no tremors, no numbness, no tingling, no dizziness Psychiatric: no depression, no anxiety  Objective:     BP 118/70 (BP Location: Left Arm, Patient Position: Sitting, Cuff Size: Large)   Pulse 78   Ht 5' 8 (1.727 m)   Wt (!) 317 lb 9.6 oz (144.1 kg)   BMI 48.29 kg/m   Wt Readings from Last 3 Encounters:  06/17/24 (!) 317 lb 9.6 oz (144.1 kg)  02/12/24 (!) 342 lb 12.8 oz (155.5 kg)  02/12/24 (!) 340 lb (154.2 kg)     BP Readings from Last 3 Encounters:  06/17/24 118/70  02/12/24 136/82  02/12/24 (!) 148/94      Physical Exam- Limited  Constitutional:  Body mass index is 48.29 kg/m. , not in acute distress, normal state of mind Eyes:  EOMI, no exophthalmos Musculoskeletal: no gross deformities, strength intact in all four extremities, no gross restriction of joint movements Skin:  no  rashes, no hyperemia Neurological: no tremor with outstretched hands   Diabetic Foot Exam - Simple   No data filed     CMP ( most recent) CMP     Component Value Date/Time   NA 140 05/08/2024 1120   K 4.4 05/08/2024 1120   CL 100 05/08/2024 1120   CO2 23 05/08/2024 1120   GLUCOSE 158 (H) 05/08/2024 1120   GLUCOSE 136 (H) 02/07/2021 2240   BUN 17 05/08/2024 1120   CREATININE 1.14 05/08/2024 1120   CREATININE 1.13 09/21/2020 0827   CALCIUM  9.8 05/08/2024 1120   PROT 6.9 05/08/2024 1120   ALBUMIN 4.2 05/08/2024 1120   AST 25 05/08/2024 1120   ALT 27 05/08/2024 1120   ALKPHOS 91 05/08/2024 1120   BILITOT 0.6 05/08/2024 1120   EGFR 80 05/08/2024 1120   GFRNONAA >60 02/07/2021 2240   GFRNONAA 79 09/21/2020 0827     Diabetic Labs (most recent): Lab Results  Component Value Date   HGBA1C 6.9 (A) 06/17/2024   HGBA1C 7.4 (A) 02/12/2024   HGBA1C 8.4 (A) 10/09/2023     Lipid Panel ( most recent) Lipid Panel     Component Value Date/Time   CHOL 119 05/08/2024 1120   TRIG 57 05/08/2024 1120   HDL 47 05/08/2024 1120   CHOLHDL 2.5 05/08/2024 1120   CHOLHDL 3.9 09/21/2020 0827   LDLCALC 59 05/08/2024 1120   LDLCALC 122 (H) 09/21/2020 0827   LABVLDL 13 05/08/2024 1120      Lab Results  Component Value Date   TSH 1.27 10/15/2019           Assessment & Plan:   1) Type 2 diabetes mellitus with hyperglycemia, without long-term current use of insulin (HCC)  He presents today with no meter or logs to review (was not asked to routinely monitor).  His POCT A1c today is 6.9%, improving from last visit of 7.4%. He has tolerated the Ozempic  well, no unpleasant side effects.  Has lost 25 lbs since last visit and is overall feeling well.  -  Joshua Chang has currently uncontrolled symptomatic type 2 DM since 48 years of age.   -Recent labs reviewed.  - I had a long discussion with him about the progressive nature of diabetes and the pathology behind its  complications. -his diabetes is complicated by ED and he remains at a high risk for more acute and chronic complications which include CAD, CVA, CKD, retinopathy, and neuropathy. These are all discussed in detail with him.  The following Lifestyle Medicine recommendations according to American College of Lifestyle Medicine [facility]) were discussed and offered to patient and he agrees to start the journey:  A. Whole Foods, Plant-based plate comprising of fruits and vegetables, plant-based proteins, whole-grain carbohydrates was discussed in detail with the patient.   A list for source of those nutrients were also provided to the patient.  Patient will use only water or unsweetened tea for hydration. B.  The need to stay away from risky substances including alcohol, smoking; obtaining 7 to 9 hours of restorative sleep, at least 150 minutes of moderate intensity exercise weekly, the importance of healthy social connections,  and stress reduction techniques were discussed. C.  A full color page of  Calorie density of various food groups per pound showing examples of each food groups was provided to the patient.  - Nutritional counseling repeated/built upon at each appointment.  - The patient admits there is a room for improvement in their diet and drink choices. -  Suggestion is made for the patient to avoid simple carbohydrates from their diet including Cakes, Sweet Desserts / Pastries, Ice Cream, Soda (diet and regular), Sweet Tea, Candies, Chips, Cookies, Sweet Pastries, Store Bought Juices, Alcohol in Excess of 1-2 drinks a day, Artificial Sweeteners, Coffee Creamer, and Sugar-free Products. This will help patient to have stable blood glucose profile and potentially avoid unintended weight gain.   - I encouraged the patient to switch to unprocessed or minimally processed complex starch and increased protein intake (animal or plant source), fruits, and vegetables.   - Patient is advised to stick to a  routine mealtimes to eat 3 meals a day and avoid unnecessary snacks (to snack only to correct hypoglycemia).  - I have approached him with the following individualized plan to manage his diabetes and patient agrees:   - he is advised to continue Metformin  1000 mg po twice daily after meals, therapeutically suitable for patient.  I increased his Ozempic  to 2 mg SQ weekly today.  -he is encouraged to continue monitoring fasting glucose 2-3 times per week and to call the clinic if his readings are less than 70 or above 200 for 3 tests in a row.    - Adjustment parameters are given to him for hypo and hyperglycemia in writing.  - Specific targets for  A1c; LDL, HDL, and Triglycerides were discussed with the patient.  2) Blood Pressure /Hypertension:  his blood pressure is controlled to target.   he is advised to continue his current medications as prescribed by his PCP.  3) Lipids/Hyperlipidemia:    Review of his recent lipid panel from 05/08/24 showed controlled LDL at 59 .  he is advised to continue Crestor  10 mg daily at bedtime.  Side effects and precautions discussed with him.  4)  Weight/Diet:  his Body mass index is 48.29 kg/m.  -  clearly complicating his diabetes care.   he is a candidate for weight loss. I discussed with him the fact that loss of 5 - 10% of his  current  body weight will have the most impact on his diabetes management.  Exercise, and detailed carbohydrates information provided  -  detailed on discharge instructions.  He asked me about weight loss surgery.  I advised him to do his research and weigh out the risks vs benefits.  This is irreversible and may cause complications down the road.  We talked about potentially trying GLP1 product first, if covered by his insurance.  5) Chronic Care/Health Maintenance: -he is on ACEI/ARB and Statin medications and is encouraged to initiate and continue to follow up with Ophthalmology, Dentist, Podiatrist at least yearly or according  to recommendations, and advised to stay away from smoking (quit smoking about 2 years ago). I have recommended yearly flu vaccine and pneumonia vaccine at least every 5 years; moderate intensity exercise for up to 150 minutes weekly; and sleep for at least 7 hours a day.  - he is advised to maintain close follow up with Cook, Jayce G, DO for primary care needs, as well as his other providers for optimal and coordinated care.      I spent  31 minutes in the care of the patient today including review of labs from CMP, Lipids, Thyroid Function, Hematology (current and previous including abstractions from other facilities); face-to-face time discussing  his blood glucose readings/logs, discussing hypoglycemia and hyperglycemia episodes and symptoms, medications doses, his options of short and long term treatment based on the latest standards of care / guidelines;  discussion about incorporating lifestyle medicine;  and documenting the encounter. Risk reduction counseling performed per USPSTF guidelines to reduce obesity and cardiovascular risk factors.     Please refer to Patient Instructions for Blood Glucose Monitoring and Insulin/Medications Dosing Guide  in media tab for additional information. Please  also refer to  Patient Self Inventory in the Media  tab for reviewed elements of pertinent patient history.  Joshua Chang participated in the discussions, expressed understanding, and voiced agreement with the above plans.  All questions were answered to his satisfaction. he is encouraged to contact clinic should he have any questions or concerns prior to his return visit.     Follow up plan: - Return in about 4 months (around 10/16/2024) for Diabetes F/U with A1c in office.   Joshua Chang, [facility] [address] [place], [place] [address] Phone: [phone] Fax: [phone]  06/17/2024, 9:51 AM

## 2024-07-12 ENCOUNTER — Other Ambulatory Visit: Payer: Self-pay | Admitting: Family Medicine

## 2024-07-12 DIAGNOSIS — I10 Essential (primary) hypertension: Secondary | ICD-10-CM

## 2024-07-14 ENCOUNTER — Encounter: Payer: Self-pay | Admitting: Nurse Practitioner

## 2024-07-14 NOTE — Telephone Encounter (Signed)
 Brittany, did his insurance card update?  PA team:  See patient message, states he needs PA for his Ozempic .

## 2024-07-14 NOTE — Telephone Encounter (Signed)
 Ok, perfect, just wanted to make sure the PA team has what they need to do the PA.

## 2024-07-22 ENCOUNTER — Other Ambulatory Visit (HOSPITAL_COMMUNITY): Payer: Self-pay

## 2024-07-23 ENCOUNTER — Other Ambulatory Visit (HOSPITAL_COMMUNITY): Payer: Self-pay

## 2024-07-23 ENCOUNTER — Telehealth: Payer: Self-pay

## 2024-07-23 NOTE — Telephone Encounter (Unsigned)
Pharmacy Patient Advocate Encounter   Received notification from {Referral Source:29445} that prior authorization for {Medication:29728} is required/requested.   Insurance verification completed.   The patient is insured through {INSRESPONSE:[id number]} .   Per test claim: {copay/ins/PA:29915}

## 2024-08-06 NOTE — Telephone Encounter (Signed)
 Patient was called and made aware.

## 2024-08-14 ENCOUNTER — Ambulatory Visit: Admitting: Family Medicine

## 2024-10-16 ENCOUNTER — Ambulatory Visit: Admitting: Nurse Practitioner
